# Patient Record
Sex: Female | Born: 1970 | Race: Black or African American | Hispanic: No | Marital: Single | Smoking: Never smoker
Health system: Southern US, Community
[De-identification: ages and names within clinical notes are randomized; demographics above are authoritative.]

## PROBLEM LIST (undated history)

## (undated) DIAGNOSIS — I10 Essential (primary) hypertension: Secondary | ICD-10-CM

## (undated) DIAGNOSIS — A749 Chlamydial infection, unspecified: Secondary | ICD-10-CM

## (undated) HISTORY — DX: Chlamydial infection, unspecified: A74.9

## (undated) HISTORY — PX: TUBAL LIGATION: SHX77

---

## 1997-08-02 ENCOUNTER — Ambulatory Visit (HOSPITAL_COMMUNITY): Admission: RE | Admit: 1997-08-02 | Discharge: 1997-08-02 | Payer: Self-pay | Admitting: *Deleted

## 1997-11-06 ENCOUNTER — Inpatient Hospital Stay (HOSPITAL_COMMUNITY): Admission: AD | Admit: 1997-11-06 | Discharge: 1997-11-06 | Payer: Self-pay | Admitting: Obstetrics

## 1997-11-22 ENCOUNTER — Inpatient Hospital Stay (HOSPITAL_COMMUNITY): Admission: AD | Admit: 1997-11-22 | Discharge: 1997-11-22 | Payer: Self-pay | Admitting: Obstetrics

## 1997-11-24 ENCOUNTER — Inpatient Hospital Stay (HOSPITAL_COMMUNITY): Admission: RE | Admit: 1997-11-24 | Discharge: 1997-11-24 | Payer: Self-pay | Admitting: Obstetrics

## 1997-12-21 ENCOUNTER — Inpatient Hospital Stay (HOSPITAL_COMMUNITY): Admission: AD | Admit: 1997-12-21 | Discharge: 1997-12-21 | Payer: Self-pay | Admitting: *Deleted

## 1997-12-24 ENCOUNTER — Inpatient Hospital Stay (HOSPITAL_COMMUNITY): Admission: AD | Admit: 1997-12-24 | Discharge: 1997-12-24 | Payer: Self-pay | Admitting: Obstetrics & Gynecology

## 1998-01-03 ENCOUNTER — Inpatient Hospital Stay (HOSPITAL_COMMUNITY): Admission: AD | Admit: 1998-01-03 | Discharge: 1998-01-03 | Payer: Self-pay | Admitting: Obstetrics & Gynecology

## 1998-01-04 ENCOUNTER — Inpatient Hospital Stay (HOSPITAL_COMMUNITY): Admission: AD | Admit: 1998-01-04 | Discharge: 1998-01-06 | Payer: Self-pay | Admitting: *Deleted

## 1998-10-19 ENCOUNTER — Encounter: Admission: RE | Admit: 1998-10-19 | Discharge: 1999-01-17 | Payer: Self-pay | Admitting: Specialist

## 2001-01-13 ENCOUNTER — Emergency Department (HOSPITAL_COMMUNITY): Admission: EM | Admit: 2001-01-13 | Discharge: 2001-01-13 | Payer: Self-pay | Admitting: Emergency Medicine

## 2001-04-25 ENCOUNTER — Ambulatory Visit (HOSPITAL_COMMUNITY): Admission: RE | Admit: 2001-04-25 | Discharge: 2001-04-25 | Payer: Self-pay | Admitting: *Deleted

## 2001-05-31 ENCOUNTER — Inpatient Hospital Stay (HOSPITAL_COMMUNITY): Admission: AD | Admit: 2001-05-31 | Discharge: 2001-05-31 | Payer: Self-pay | Admitting: *Deleted

## 2001-06-03 ENCOUNTER — Ambulatory Visit (HOSPITAL_COMMUNITY): Admission: RE | Admit: 2001-06-03 | Discharge: 2001-06-03 | Payer: Self-pay | Admitting: *Deleted

## 2001-06-03 ENCOUNTER — Encounter: Payer: Self-pay | Admitting: *Deleted

## 2001-08-23 ENCOUNTER — Inpatient Hospital Stay (HOSPITAL_COMMUNITY): Admission: AD | Admit: 2001-08-23 | Discharge: 2001-08-23 | Payer: Self-pay | Admitting: *Deleted

## 2001-08-27 ENCOUNTER — Inpatient Hospital Stay (HOSPITAL_COMMUNITY): Admission: AD | Admit: 2001-08-27 | Discharge: 2001-08-27 | Payer: Self-pay | Admitting: Obstetrics and Gynecology

## 2001-08-29 ENCOUNTER — Inpatient Hospital Stay (HOSPITAL_COMMUNITY): Admission: AD | Admit: 2001-08-29 | Discharge: 2001-08-29 | Payer: Self-pay | Admitting: *Deleted

## 2001-09-03 ENCOUNTER — Inpatient Hospital Stay (HOSPITAL_COMMUNITY): Admission: AD | Admit: 2001-09-03 | Discharge: 2001-09-03 | Payer: Self-pay | Admitting: Obstetrics and Gynecology

## 2001-09-04 ENCOUNTER — Inpatient Hospital Stay (HOSPITAL_COMMUNITY): Admission: AD | Admit: 2001-09-04 | Discharge: 2001-09-04 | Payer: Self-pay | Admitting: *Deleted

## 2001-09-08 ENCOUNTER — Inpatient Hospital Stay (HOSPITAL_COMMUNITY): Admission: AD | Admit: 2001-09-08 | Discharge: 2001-09-08 | Payer: Self-pay | Admitting: *Deleted

## 2001-09-23 ENCOUNTER — Encounter (INDEPENDENT_AMBULATORY_CARE_PROVIDER_SITE_OTHER): Payer: Self-pay

## 2001-09-23 ENCOUNTER — Inpatient Hospital Stay (HOSPITAL_COMMUNITY): Admission: AD | Admit: 2001-09-23 | Discharge: 2001-09-25 | Payer: Self-pay | Admitting: *Deleted

## 2001-10-24 ENCOUNTER — Emergency Department (HOSPITAL_COMMUNITY): Admission: EM | Admit: 2001-10-24 | Discharge: 2001-10-24 | Payer: Self-pay | Admitting: Emergency Medicine

## 2002-12-08 ENCOUNTER — Other Ambulatory Visit: Admission: RE | Admit: 2002-12-08 | Discharge: 2002-12-08 | Payer: Self-pay | Admitting: Family Medicine

## 2004-04-24 ENCOUNTER — Ambulatory Visit: Payer: Self-pay | Admitting: Family Medicine

## 2004-05-11 ENCOUNTER — Ambulatory Visit: Payer: Self-pay | Admitting: Family Medicine

## 2004-05-11 ENCOUNTER — Other Ambulatory Visit: Admission: RE | Admit: 2004-05-11 | Discharge: 2004-05-11 | Payer: Self-pay | Admitting: Family Medicine

## 2004-05-15 ENCOUNTER — Ambulatory Visit: Payer: Self-pay | Admitting: *Deleted

## 2004-08-24 ENCOUNTER — Ambulatory Visit: Payer: Self-pay | Admitting: Family Medicine

## 2005-05-14 ENCOUNTER — Ambulatory Visit: Payer: Self-pay | Admitting: Family Medicine

## 2006-06-19 ENCOUNTER — Encounter (INDEPENDENT_AMBULATORY_CARE_PROVIDER_SITE_OTHER): Payer: Self-pay | Admitting: Family Medicine

## 2006-06-19 ENCOUNTER — Ambulatory Visit: Payer: Self-pay | Admitting: Family Medicine

## 2006-06-21 ENCOUNTER — Encounter (INDEPENDENT_AMBULATORY_CARE_PROVIDER_SITE_OTHER): Payer: Self-pay | Admitting: Family Medicine

## 2006-06-21 LAB — CONVERTED CEMR LAB: Pap Smear: NORMAL

## 2006-07-03 ENCOUNTER — Ambulatory Visit: Payer: Self-pay | Admitting: Family Medicine

## 2006-07-17 ENCOUNTER — Ambulatory Visit: Payer: Self-pay | Admitting: Family Medicine

## 2006-07-18 ENCOUNTER — Encounter (INDEPENDENT_AMBULATORY_CARE_PROVIDER_SITE_OTHER): Payer: Self-pay | Admitting: Family Medicine

## 2006-07-18 LAB — CONVERTED CEMR LAB: TSH: 1.861 microintl units/mL

## 2007-02-05 ENCOUNTER — Encounter (INDEPENDENT_AMBULATORY_CARE_PROVIDER_SITE_OTHER): Payer: Self-pay | Admitting: Family Medicine

## 2007-02-05 DIAGNOSIS — Z9189 Other specified personal risk factors, not elsewhere classified: Secondary | ICD-10-CM | POA: Insufficient documentation

## 2007-02-19 ENCOUNTER — Encounter (INDEPENDENT_AMBULATORY_CARE_PROVIDER_SITE_OTHER): Payer: Self-pay | Admitting: *Deleted

## 2007-03-10 DIAGNOSIS — I10 Essential (primary) hypertension: Secondary | ICD-10-CM | POA: Insufficient documentation

## 2007-06-09 ENCOUNTER — Ambulatory Visit: Payer: Self-pay | Admitting: Internal Medicine

## 2007-06-09 ENCOUNTER — Inpatient Hospital Stay (HOSPITAL_COMMUNITY): Admission: AD | Admit: 2007-06-09 | Discharge: 2007-06-10 | Payer: Self-pay | Admitting: Obstetrics & Gynecology

## 2007-07-16 ENCOUNTER — Encounter: Payer: Self-pay | Admitting: Internal Medicine

## 2007-07-16 ENCOUNTER — Ambulatory Visit: Payer: Self-pay | Admitting: Internal Medicine

## 2007-07-16 ENCOUNTER — Encounter: Payer: Self-pay | Admitting: Family Medicine

## 2008-02-18 ENCOUNTER — Ambulatory Visit: Payer: Self-pay | Admitting: Internal Medicine

## 2008-07-10 ENCOUNTER — Emergency Department (HOSPITAL_COMMUNITY): Admission: EM | Admit: 2008-07-10 | Discharge: 2008-07-10 | Payer: Self-pay | Admitting: Emergency Medicine

## 2008-07-22 ENCOUNTER — Encounter (INDEPENDENT_AMBULATORY_CARE_PROVIDER_SITE_OTHER): Payer: Self-pay | Admitting: Adult Health

## 2008-07-22 ENCOUNTER — Ambulatory Visit: Payer: Self-pay | Admitting: Internal Medicine

## 2008-07-22 LAB — CONVERTED CEMR LAB
ALT: 13 units/L (ref 0–35)
AST: 15 units/L (ref 0–37)
Albumin: 4 g/dL (ref 3.5–5.2)
Alkaline Phosphatase: 58 units/L (ref 39–117)
BUN: 12 mg/dL (ref 6–23)
Basophils Absolute: 0 10*3/uL (ref 0.0–0.1)
Basophils Relative: 0 % (ref 0–1)
CO2: 23 meq/L (ref 19–32)
Calcium: 9 mg/dL (ref 8.4–10.5)
Chlamydia, DNA Probe: NEGATIVE
Chloride: 107 meq/L (ref 96–112)
Creatinine, Ser: 0.78 mg/dL (ref 0.40–1.20)
Eosinophils Absolute: 0.1 10*3/uL (ref 0.0–0.7)
Eosinophils Relative: 2 % (ref 0–5)
Glucose, Bld: 97 mg/dL (ref 70–99)
HCT: 39.4 % (ref 36.0–46.0)
Hemoglobin: 13 g/dL (ref 12.0–15.0)
Lymphocytes Relative: 39 % (ref 12–46)
Lymphs Abs: 2.6 10*3/uL (ref 0.7–4.0)
MCHC: 33 g/dL (ref 30.0–36.0)
MCV: 83.8 fL (ref 78.0–100.0)
Monocytes Absolute: 0.6 10*3/uL (ref 0.1–1.0)
Monocytes Relative: 9 % (ref 3–12)
Neutro Abs: 3.3 10*3/uL (ref 1.7–7.7)
Neutrophils Relative %: 50 % (ref 43–77)
Platelets: 316 10*3/uL (ref 150–400)
Potassium: 3.4 meq/L — ABNORMAL LOW (ref 3.5–5.3)
RBC: 4.7 M/uL (ref 3.87–5.11)
RDW: 13.4 % (ref 11.5–15.5)
Sodium: 141 meq/L (ref 135–145)
TSH: 1.078 microintl units/mL (ref 0.350–4.50)
Total Bilirubin: 0.3 mg/dL (ref 0.3–1.2)
Total Protein: 7.3 g/dL (ref 6.0–8.3)
WBC: 6.7 10*3/uL (ref 4.0–10.5)

## 2008-08-12 ENCOUNTER — Encounter (INDEPENDENT_AMBULATORY_CARE_PROVIDER_SITE_OTHER): Payer: Self-pay | Admitting: Adult Health

## 2008-08-12 ENCOUNTER — Ambulatory Visit: Payer: Self-pay | Admitting: Internal Medicine

## 2008-08-12 LAB — CONVERTED CEMR LAB: LDL Cholesterol: 77 mg/dL (ref 0–99)

## 2009-02-09 ENCOUNTER — Ambulatory Visit: Payer: Self-pay | Admitting: Family Medicine

## 2009-02-10 ENCOUNTER — Encounter (INDEPENDENT_AMBULATORY_CARE_PROVIDER_SITE_OTHER): Payer: Self-pay | Admitting: *Deleted

## 2009-02-10 LAB — CONVERTED CEMR LAB: Microalb, Ur: 2.16 mg/dL — ABNORMAL HIGH (ref 0.00–1.89)

## 2009-08-30 ENCOUNTER — Emergency Department (HOSPITAL_COMMUNITY): Admission: EM | Admit: 2009-08-30 | Discharge: 2009-08-30 | Payer: Self-pay | Admitting: Family Medicine

## 2009-09-13 ENCOUNTER — Ambulatory Visit: Payer: Self-pay | Admitting: Family Medicine

## 2009-09-13 ENCOUNTER — Encounter (INDEPENDENT_AMBULATORY_CARE_PROVIDER_SITE_OTHER): Payer: Self-pay | Admitting: Adult Health

## 2009-09-13 LAB — CONVERTED CEMR LAB
AST: 13 units/L (ref 0–37)
Albumin: 4 g/dL (ref 3.5–5.2)
Alkaline Phosphatase: 53 units/L (ref 39–117)
BUN: 12 mg/dL (ref 6–23)
Basophils Absolute: 0 10*3/uL (ref 0.0–0.1)
Eosinophils Relative: 2 % (ref 0–5)
GC Probe Amp, Genital: NEGATIVE
Lymphocytes Relative: 44 % (ref 12–46)
Neutro Abs: 2.9 10*3/uL (ref 1.7–7.7)
Pap Smear: NEGATIVE
Platelets: 248 10*3/uL (ref 150–400)
Potassium: 3.4 meq/L — ABNORMAL LOW (ref 3.5–5.3)
RDW: 13.5 % (ref 11.5–15.5)
Sodium: 136 meq/L (ref 135–145)
Vit D, 25-Hydroxy: 10 ng/mL — ABNORMAL LOW (ref 30–89)

## 2009-11-28 ENCOUNTER — Ambulatory Visit: Payer: Self-pay | Admitting: Internal Medicine

## 2009-11-30 ENCOUNTER — Ambulatory Visit: Payer: Self-pay | Admitting: Internal Medicine

## 2010-06-08 ENCOUNTER — Encounter (INDEPENDENT_AMBULATORY_CARE_PROVIDER_SITE_OTHER): Payer: Self-pay | Admitting: *Deleted

## 2010-06-08 LAB — CONVERTED CEMR LAB
CO2: 28 meq/L (ref 19–32)
Chloride: 105 meq/L (ref 96–112)
Creatinine, Ser: 0.96 mg/dL (ref 0.40–1.20)
Potassium: 3.8 meq/L (ref 3.5–5.3)

## 2010-10-20 NOTE — Op Note (Signed)
[facility]  Patient:    Leslie Wang, Leslie Wang Visit Number: [id number] MRN: [id number]          Service Type: OBS Location: [id number] Attending Physician:  Michaelle Copas Dictated by:   Ed Blalock. Burnadette Peter, M.D. Proc. Date: 09/24/01 Admit Date:  09/23/2001                             Operative Report  DATE OF BIRTH:                [birth year]  PREOPERATIVE DIAGNOSES:       Multiparity, desires permanent sterilization.  POSTOPERATIVE DIAGNOSES:      Multiparity, desires permanent sterilization.  PROCEDURE:                    Postpartum tubal ligation.  SURGEON:                      Dr. Okey Dupre  ASSISTANT:                    Dr. Burnadette Peter  ANESTHESIA:                   Epidural.  COMPLICATIONS:                None.  ESTIMATED BLOOD LOSS:         Minimal.  FLUIDS:                       1000 cc LR.  URINE OUTPUT:                 None.  INDICATIONS:                  This is a 40 year old G4, P3 who is postpartum day #1 from a spontaneous vaginal delivery who desires permanent sterilization.  Risks, benefits of the procedure were discussed with the patient including bleeding, infection, risk of damage to surrounding organs, and an increased risk of ectopic gestation if pregnancy occurs.  Patient wishes to proceed.  FINDINGS:                     Normal uterus, ovaries, and tubes.  PROCEDURE:                    Patient was taken to OR where her epidural was redosed and found to be adequate.  A small transverse infraumbilical incision was made with the scalpel.  The incision was carried down to the underlying fascia until the peritoneum was identified and entered.  The peritoneum was noted to be free of any adhesions and incision was extended with Metzenbaum scissors.  The patients left fallopian tube was identified, brought through the incision, and grasped with Babcock clamp.  The tube was then followed out to the fimbria.  The  Babcock clamp was then used to grasp the tube approximately 4 cm from the cornual region.  A 3 cm segment of tube was then ligated with a free tie of plain gut x2 and then excised.  Good hemostasis was noted and the tube was returned to the abdomen.  The right fallopian tube was then ligated and a 3 cm segment excised in a similar fashion.  Excellent hemostasis.  Tube returned to the abdomen.  The peritoneum and fascia were then closed  in a single layer with 0 Vicryl. Second layer was placed subcutaneously with the same suture for good closure. The skin was then closed with Dermabond.  The patient tolerated the procedure well.  Sponge, lap, and needle counts were correct x2.  The patient was taken to the recovery room in stable condition.  PATHOLOGY:                    Segments of the right and left fallopian tubes were sent. Dictated by:   Ed Blalock. Burnadette Peter, M.D. Attending Physician:  Michaelle Copas DD:  09/24/01 TD:  09/24/01 Job: [id number]

## 2011-02-21 LAB — POCT PREGNANCY, URINE
Operator id: 12584
Preg Test, Ur: NEGATIVE

## 2011-02-21 LAB — CBC
HCT: 40.5
Platelets: 339
RDW: 12.9

## 2011-02-21 LAB — URINE MICROSCOPIC-ADD ON

## 2011-02-21 LAB — URINALYSIS, ROUTINE W REFLEX MICROSCOPIC
Bilirubin Urine: NEGATIVE
Glucose, UA: NEGATIVE
Hgb urine dipstick: NEGATIVE
Ketones, ur: 15 — AB
Nitrite: NEGATIVE
Protein, ur: NEGATIVE
Specific Gravity, Urine: >1.03 — ABNORMAL HIGH
Urobilinogen, UA: 0.2
pH: 5

## 2011-02-21 LAB — WET PREP, GENITAL: Yeast Wet Prep HPF POC: NONE SEEN

## 2011-10-02 ENCOUNTER — Other Ambulatory Visit: Payer: Self-pay | Admitting: Specialist

## 2011-10-02 DIAGNOSIS — R1900 Intra-abdominal and pelvic swelling, mass and lump, unspecified site: Secondary | ICD-10-CM

## 2011-10-04 ENCOUNTER — Other Ambulatory Visit: Payer: Self-pay

## 2011-10-09 ENCOUNTER — Other Ambulatory Visit: Payer: Self-pay

## 2011-10-10 ENCOUNTER — Other Ambulatory Visit: Payer: Self-pay

## 2011-10-11 ENCOUNTER — Ambulatory Visit
Admission: RE | Admit: 2011-10-11 | Discharge: 2011-10-11 | Disposition: A | Discharge status: Home / Self Care | Payer: Medicaid Other | Source: Ambulatory Visit | Attending: Specialist | Admitting: Specialist

## 2011-10-11 DIAGNOSIS — R1900 Intra-abdominal and pelvic swelling, mass and lump, unspecified site: Secondary | ICD-10-CM

## 2012-08-07 ENCOUNTER — Encounter (HOSPITAL_COMMUNITY): Payer: Self-pay | Admitting: Emergency Medicine

## 2012-08-07 ENCOUNTER — Emergency Department (HOSPITAL_COMMUNITY)
Admission: EM | Admit: 2012-08-07 | Discharge: 2012-08-07 | Disposition: A | Discharge status: Home / Self Care | Payer: Medicaid Other | Source: Home / Self Care | Attending: Emergency Medicine | Admitting: Emergency Medicine

## 2012-08-07 DIAGNOSIS — S81009A Unspecified open wound, unspecified knee, initial encounter: Secondary | ICD-10-CM

## 2012-08-07 HISTORY — DX: Essential (primary) hypertension: I10

## 2012-08-07 MED ORDER — HYDROCODONE-ACETAMINOPHEN 5-325 MG PO TABS: ORAL_TABLET | ORAL | Status: DC

## 2012-08-07 MED ORDER — CEPHALEXIN 500 MG PO CAPS: 500.0000 mg | ORAL_CAPSULE | Freq: Three times a day (TID) | ORAL | Status: DC

## 2012-08-07 NOTE — ED Notes (Signed)
Laceration to left foot, injured yesterday

## 2012-08-07 NOTE — ED Notes (Signed)
MD at bedside. 

## 2012-08-07 NOTE — ED Notes (Signed)
Patient being treated and son being treated in the same treatment room

## 2012-08-07 NOTE — ED Provider Notes (Addendum)
Chief Complaint  Patient presents with  . Laceration    History of Present Illness:   Leslie Wang is a 42 year old female who dropped a picture on her left foot last night while she was trying to hang it. This caused a laceration over the medial aspect of the ankle. This was a little bit of blood but otherwise bleeding has been controlled. It feels numb and hurts. Has been no redness or swelling. No purulent drainage. She did have a tetanus shot about a year ago. Past medical history is positive for hypertension.  Review of Systems:  Other than noted above, the patient denies any of the following symptoms: Systemic:  No fever or chills. Musculoskeletal:  No joint pain or decreased range of motion. Neuro:  No numbness, tingling, or weakness.  PMFSH:  Past medical history, family history, social history, meds, and allergies were reviewed.  Physical Exam:   Vital signs:  BP 144/92  Pulse 80  Temp(Src) 98.5 F (36.9 C) (Oral)  Resp 16  SpO2 100%  LMP 08/07/2012 Ext:  There is a small annular shaped skin tear over the medial aspect of the left ankle. There is no evidence of infection with no surrounding erythema, pain to palpation, or purulent drainage.  All other joints had a full ROM without pain.  Pulses were full.  Good capillary refill in all digits.  No edema. Neurological:  Alert and oriented.  No muscle weakness.  Sensation was intact to light touch.   Procedure: Verbal informed consent was obtained.  The patient was informed of the risks and benefits of the procedure and understands and accepts.  Identity of the patient was verified verbally and by wristband.   The laceration area described above was cleansed with saline, tincture of benzoin was applied and the skin tear was Steri-Stripped down. A Telfa dressing was applied and Coban wrap. The patient was instructed in wound care. She should avoid applying antibiotic ointment. She should allow the Steri-Strips to fall off on their  own.   Assessment:  The encounter diagnosis was Skin tear of lower leg without complication, left, initial encounter.  Plan:   1.  The following meds were prescribed:   Discharge Medication List as of 08/07/2012  5:25 PM    START taking these medications   Details  cephALEXin (KEFLEX) 500 MG capsule Take 1 capsule (500 mg total) by mouth 3 (three) times daily., Starting 08/07/2012, Until Discontinued, Normal    HYDROcodone-acetaminophen (NORCO/VICODIN) 5-325 MG per tablet 1 to 2 tabs every 4 to 6 hours as needed for pain., Print       2.  The patient was instructed in wound care and pain control, and handouts were given. 3.  The patient was told to return if any sign of infection.     Reuben Likes, MD 08/07/12 1900  Reuben Likes, MD 08/07/12 1901

## 2013-12-05 ENCOUNTER — Encounter (HOSPITAL_COMMUNITY): Payer: Self-pay | Admitting: Emergency Medicine

## 2013-12-05 ENCOUNTER — Emergency Department (HOSPITAL_COMMUNITY): Payer: Medicaid Other

## 2013-12-05 ENCOUNTER — Emergency Department (INDEPENDENT_AMBULATORY_CARE_PROVIDER_SITE_OTHER): Payer: Medicaid Other

## 2013-12-05 ENCOUNTER — Emergency Department (HOSPITAL_COMMUNITY)
Admission: EM | Admit: 2013-12-05 | Discharge: 2013-12-05 | Disposition: A | Discharge status: Home / Self Care | Payer: Medicaid Other | Source: Home / Self Care | Attending: Emergency Medicine | Admitting: Emergency Medicine

## 2013-12-05 DIAGNOSIS — L03119 Cellulitis of unspecified part of limb: Secondary | ICD-10-CM

## 2013-12-05 DIAGNOSIS — M79671 Pain in right foot: Secondary | ICD-10-CM

## 2013-12-05 DIAGNOSIS — L02419 Cutaneous abscess of limb, unspecified: Secondary | ICD-10-CM | POA: Diagnosis not present

## 2013-12-05 DIAGNOSIS — M79609 Pain in unspecified limb: Secondary | ICD-10-CM

## 2013-12-05 LAB — CBC WITH DIFFERENTIAL/PLATELET
Basophils Absolute: 0 10*3/uL (ref 0.0–0.1)
Basophils Relative: 0 % (ref 0–1)
Eosinophils Absolute: 0.1 10*3/uL (ref 0.0–0.7)
Eosinophils Relative: 2 % (ref 0–5)
HCT: 38 % (ref 36.0–46.0)
Hemoglobin: 12.8 g/dL (ref 12.0–15.0)
Lymphocytes Relative: 34 % (ref 12–46)
Lymphs Abs: 2.3 10*3/uL (ref 0.7–4.0)
MCH: 28.2 pg (ref 26.0–34.0)
MCHC: 33.7 g/dL (ref 30.0–36.0)
MCV: 83.7 fL (ref 78.0–100.0)
Monocytes Absolute: 0.4 10*3/uL (ref 0.1–1.0)
Monocytes Relative: 6 % (ref 3–12)
Neutro Abs: 4.1 10*3/uL (ref 1.7–7.7)
Neutrophils Relative %: 58 % (ref 43–77)
Platelets: 241 10*3/uL (ref 150–400)
RBC: 4.54 MIL/uL (ref 3.87–5.11)
RDW: 13.3 % (ref 11.5–15.5)
WBC: 7 10*3/uL (ref 4.0–10.5)

## 2013-12-05 LAB — URIC ACID: Uric Acid, Serum: 5.8 mg/dL (ref 2.4–7.0)

## 2013-12-05 MED ORDER — IBUPROFEN 800 MG PO TABS
ORAL_TABLET | ORAL | Status: AC
  Filled 2013-12-05: qty 1

## 2013-12-05 MED ORDER — CEPHALEXIN 500 MG PO CAPS: 500.0000 mg | ORAL_CAPSULE | Freq: Three times a day (TID) | ORAL | Status: DC

## 2013-12-05 MED ORDER — PREDNISONE 20 MG PO TABS: 20.0000 mg | ORAL_TABLET | Freq: Two times a day (BID) | ORAL | Status: DC

## 2013-12-05 MED ORDER — OXYCODONE-ACETAMINOPHEN 5-325 MG PO TABS: ORAL_TABLET | ORAL | Status: DC

## 2013-12-05 MED ORDER — SULFAMETHOXAZOLE-TMP DS 800-160 MG PO TABS: 1.0000 | ORAL_TABLET | Freq: Two times a day (BID) | ORAL | Status: DC

## 2013-12-05 MED ORDER — COLCHICINE 0.6 MG PO TABS: ORAL_TABLET | ORAL | Status: AC

## 2013-12-05 MED ORDER — IBUPROFEN 800 MG PO TABS
800.0000 mg | ORAL_TABLET | Freq: Once | ORAL | Status: AC
  Administered 2013-12-05: 800 mg via ORAL

## 2013-12-05 NOTE — ED Notes (Signed)
States woke up with right foot pain.  Denies any injury.

## 2013-12-05 NOTE — Discharge Instructions (Signed)
Cellulitis °Cellulitis is an infection of the skin and the tissue beneath it. The infected area is usually red and tender. Cellulitis occurs most often in the arms and lower legs.  °CAUSES  °Cellulitis is caused by bacteria that enter the skin through cracks or cuts in the skin. The most common types of bacteria that cause cellulitis are Staphylococcus and Streptococcus. °SYMPTOMS  °· Redness and warmth. °· Swelling. °· Tenderness or pain. °· Fever. °DIAGNOSIS  °Your caregiver can usually determine what is wrong based on a physical exam. Blood tests may also be done. °TREATMENT  °Treatment usually involves taking an antibiotic medicine. °HOME CARE INSTRUCTIONS  °· Take your antibiotics as directed. Finish them even if you start to feel better. °· Keep the infected arm or leg elevated to reduce swelling. °· Apply a warm cloth to the affected area up to 4 times per day to relieve pain. °· Only take over-the-counter or prescription medicines for pain, discomfort, or fever as directed by your caregiver. °· Keep all follow-up appointments as directed by your caregiver. °SEEK MEDICAL CARE IF:  °· You notice red streaks coming from the infected area. °· Your red area gets larger or turns dark in color. °· Your bone or joint underneath the infected area becomes painful after the skin has healed. °· Your infection returns in the same area or another area. °· You notice a swollen bump in the infected area. °· You develop new symptoms. °SEEK IMMEDIATE MEDICAL CARE IF:  °· You have a fever. °· You feel very sleepy. °· You develop vomiting or diarrhea. °· You have a general ill feeling (malaise) with muscle aches and pains. °MAKE SURE YOU:  °· Understand these instructions. °· Will watch your condition. °· Will get help right away if you are not doing well or get worse. °Document Released: 02/28/2005 Document Revised: 11/20/2011 Document Reviewed: 08/06/2011 °ExitCare® Patient Information ©2015 [employer]. This information is  not intended to replace advice given to you by your health care provider. Make sure you discuss any questions you have with your health care provider. ° °Gout °Gout is an inflammatory arthritis caused by a buildup of uric acid crystals in the joints. Uric acid is a chemical that is normally present in the blood. When the level of uric acid in the blood is too high it can form crystals that deposit in your joints and tissues. This causes joint redness, soreness, and swelling (inflammation). Repeat attacks are common. Over time, uric acid crystals can form into masses (tophi) near a joint, destroying bone and causing disfigurement. Gout is treatable and often preventable. °CAUSES  °The disease begins with elevated levels of uric acid in the blood. Uric acid is produced by your body when it breaks down a naturally found substance called purines. Certain foods you eat, such as meats and fish, contain high amounts of purines. Causes of an elevated uric acid level include: °· Being passed down from parent to child (heredity). °· Diseases that cause increased uric acid production (such as obesity, psoriasis, and certain cancers). °· Excessive alcohol use. °· Diet, especially diets rich in meat and seafood. °· Medicines, including certain cancer-fighting medicines (chemotherapy), water pills (diuretics), and aspirin. °· Chronic kidney disease. The kidneys are no longer able to remove uric acid well. °· Problems with metabolism. °Conditions strongly associated with gout include: °· Obesity. °· High blood pressure. °· High cholesterol. °· Diabetes. °Not everyone with elevated uric acid levels gets gout. It is not understood why some   people get gout and others do not. Surgery, joint injury, and eating too much of certain foods are some of the factors that can lead to gout attacks. °SYMPTOMS  °· An attack of gout comes on quickly. It causes intense pain with redness, swelling, and warmth in a joint. °· Fever can  occur. °· Often, only one joint is involved. Certain joints are more commonly involved: °¨ Base of the big toe. °¨ Knee. °¨ Ankle. °¨ Wrist. °¨ Finger. °Without treatment, an attack usually goes away in a few days to weeks. Between attacks, you usually will not have symptoms, which is different from many other forms of arthritis. °DIAGNOSIS  °Your caregiver will suspect gout based on your symptoms and exam. In some cases, tests may be recommended. The tests may include: °· Blood tests. °· Urine tests. °· X-rays. °· Joint fluid exam. This exam requires a needle to remove fluid from the joint (arthrocentesis). Using a microscope, gout is confirmed when uric acid crystals are seen in the joint fluid. °TREATMENT  °There are two phases to gout treatment: treating the sudden onset (acute) attack and preventing attacks (prophylaxis). °· Treatment of an Acute Attack. °¨ Medicines are used. These include anti-inflammatory medicines or steroid medicines. °¨ An injection of steroid medicine into the affected joint is sometimes necessary. °¨ The painful joint is rested. Movement can worsen the arthritis. °¨ You may use warm or cold treatments on painful joints, depending which works best for you. °· Treatment to Prevent Attacks. °¨ If you suffer from frequent gout attacks, your caregiver may advise preventive medicine. These medicines are started after the acute attack subsides. These medicines either help your kidneys eliminate uric acid from your body or decrease your uric acid production. You may need to stay on these medicines for a very long time. °¨ The early phase of treatment with preventive medicine can be associated with an increase in acute gout attacks. For this reason, during the first few months of treatment, your caregiver may also advise you to take medicines usually used for acute gout treatment. Be sure you understand your caregiver's directions. Your caregiver may make several adjustments to your medicine  dose before these medicines are effective. °¨ Discuss dietary treatment with your caregiver or dietitian. Alcohol and drinks high in sugar and fructose and foods such as meat, poultry, and seafood can increase uric acid levels. Your caregiver or dietician can advise you on drinks and foods that should be limited. °HOME CARE INSTRUCTIONS  °· Do not take aspirin to relieve pain. This raises uric acid levels. °· Only take over-the-counter or prescription medicines for pain, discomfort, or fever as directed by your caregiver. °· Rest the joint as much as possible. When in bed, keep sheets and blankets off painful areas. °· Keep the affected joint raised (elevated). °· Apply warm or cold treatments to painful joints. Use of warm or cold treatments depends on which works best for you. °· Use crutches if the painful joint is in your leg. °· Drink enough fluids to keep your urine clear or pale yellow. This helps your body get rid of uric acid. Limit alcohol, sugary drinks, and fructose drinks. °· Follow your dietary instructions. Pay careful attention to the amount of protein you eat. Your daily diet should emphasize fruits, vegetables, whole grains, and fat-free or low-fat milk products. Discuss the use of coffee, vitamin C, and cherries with your caregiver or dietician. These may be helpful in lowering uric acid levels. °· Maintain a   healthy body weight. °SEEK MEDICAL CARE IF:  °· You develop diarrhea, vomiting, or any side effects from medicines. °· You do not feel better in 24 hours, or you are getting worse. °SEEK IMMEDIATE MEDICAL CARE IF:  °· Your joint becomes suddenly more tender, and you have chills or a fever. °MAKE SURE YOU:  °· Understand these instructions. °· Will watch your condition. °· Will get help right away if you are not doing well or get worse. °Document Released: 05/18/2000 Document Revised: 09/15/2012 Document Reviewed: 01/02/2012 °ExitCare® Patient Information ©2015 [employer]. This information  is not intended to replace advice given to you by your health care provider. Make sure you discuss any questions you have with your health care provider. ° °

## 2013-12-05 NOTE — ED Provider Notes (Signed)
Chief Complaint   Chief Complaint  Patient presents with  . Foot Pain    History of Present Illness   Leslie Wang is a 43 year old female who woke up this morning with pain and swelling over the dorsum of the right foot. She denies any injury to the foot. She's had no fever or chills. The pain seems to be concentrated with the fourth and fifth metatarsals. She has no skin lesions and no purulent drainage. The pain, swelling, and discomfort seem to be radiating up into the ankle and into the lower leg. She has no history of gout and no history of similar episodes in the past.  Review of Systems   Other than as noted above, the patient denies any of the following symptoms: Systemic:  No fevers or chills. Musculoskeletal:  No joint pain or arthritis.  Neurological:  No muscular weakness, paresthesias.   PMFSH   Past medical history, family history, social history, meds, and allergies were reviewed.   She has high blood pressure and takes lisinopril and metoprolol.  Physical  Examination     Vital signs:  BP 122/88  Pulse 76  Temp(Src) 98.6 F (37 C) (Oral)  Resp 16  SpO2 99%  LMP 11/11/2013 Gen:  Alert and oriented times 3.  In no distress. Musculoskeletal:  Exam of the foot reveals there is minimal swelling and erythema over the dorsum of the foot overlying the fourth and fifth metatarsals extending to the webspace between the fourth and fifth toes. I was unable to get a good look between these toes since she is so painful and a cystoscopy tender. She also has some tenderness to palpation the ankle but not in the calf or lower leg. The ankle is not swollen.  Otherwise, all joints had a full a ROM with no swelling, bruising or deformity.  No edema, pulses full. Extremities were warm and pink.  Capillary refill was brisk.  Skin:  Clear, warm and dry.  No rash. Neuro:  Alert and oriented times 3.  Muscle strength was normal.  Sensation was intact to light touch.    Radiology    Dg Foot Complete Right  12/05/2013   CLINICAL DATA:  FOOT PAIN FOOT PAIN  EXAM: RIGHT FOOT COMPLETE - 3+ VIEW  COMPARISON:  None.  FINDINGS: There is no evidence of fracture or dislocation. There is no evidence of arthropathy or other focal bone abnormality. Soft tissues are unremarkable.  IMPRESSION: Negative.   Electronically Signed   By: Salome HolmesHector  Cooper M.D.   On: 12/05/2013 15:23   I reviewed the images independently and personally and concur with the radiologist's findings.   Labs   Results for orders placed during the hospital encounter of 12/05/13  CBC WITH DIFFERENTIAL      Result Value Ref Range   WBC 7.0  4.0 - 10.5 K/uL   RBC 4.54  3.87 - 5.11 MIL/uL   Hemoglobin 12.8  12.0 - 15.0 g/dL   HCT 16.138.0  09.636.0 - 04.546.0 %   MCV 83.7  78.0 - 100.0 fL   MCH 28.2  26.0 - 34.0 pg   MCHC 33.7  30.0 - 36.0 g/dL   RDW 40.913.3  81.111.5 - 91.415.5 %   Platelets 241  150 - 400 K/uL   Neutrophils Relative % 58  43 - 77 %   Neutro Abs 4.1  1.7 - 7.7 K/uL   Lymphocytes Relative 34  12 - 46 %   Lymphs Abs 2.3  0.7 -  4.0 K/uL   Monocytes Relative 6  3 - 12 %   Monocytes Absolute 0.4  0.1 - 1.0 K/uL   Eosinophils Relative 2  0 - 5 %   Eosinophils Absolute 0.1  0.0 - 0.7 K/uL   Basophils Relative 0  0 - 1 %   Basophils Absolute 0.0  0.0 - 0.1 K/uL   A uric acid level was also obtained.  Course in Urgent Care Center   She was placed in a postoperative boot.  Assessment   The encounter diagnosis was Foot pain, right.  Differential diagnosis includes gout or cellulitis.  Plan    1.  Meds:  The following meds were prescribed:   Discharge Medication List as of 12/05/2013  3:32 PM    START taking these medications   Details  !! cephALEXin (KEFLEX) 500 MG capsule Take 1 capsule (500 mg total) by mouth 3 (three) times daily., Starting 12/05/2013, Until Discontinued, Normal    colchicine 0.6 MG tablet Take 2 now and 1 in 1 hour.  May repeat dose once daily.  For gout attack., Normal     oxyCODONE-acetaminophen (PERCOCET) 5-325 MG per tablet 1 to 2 tablets every 6 hours as needed for pain., Print    predniSONE (DELTASONE) 20 MG tablet Take 1 tablet (20 mg total) by mouth 2 (two) times daily., Starting 12/05/2013, Until Discontinued, Normal    sulfamethoxazole-trimethoprim (BACTRIM DS) 800-160 MG per tablet Take 1 tablet by mouth 2 (two) times daily., Starting 12/05/2013, Until Discontinued, Normal     !! - Potential duplicate medications found. Please discuss with provider.      2.  Patient Education/Counseling:  The patient was given appropriate handouts, self care instructions, and instructed in symptomatic relief including rest and activity, elevation, application of ice and compression.  She was instructed to stay off of her feet and elevate her leg for the next 2 days. Return again on Monday for followup. I will call her back about lab results.  3.  Follow up:  The patient was told to follow up here in 2 days, or sooner if becoming worse in any way, and given some red flag symptoms such as worsening pain, fever, or progressive swelling which would prompt immediate return.      Reuben Likesavid C Rosie Golson, MD 12/05/13 [phone]

## 2013-12-06 NOTE — Progress Notes (Signed)
Quick Note:  Test result was normal. No further action is needed at this time. The patient was called and informed of these normal results. She states her foot is still sore, but the swelling has gone down a little bit. She denies any fever. She is taking all her medications. She plans to return tomorrow for recheck. ______

## 2013-12-07 ENCOUNTER — Encounter (HOSPITAL_COMMUNITY): Payer: Self-pay | Admitting: Emergency Medicine

## 2013-12-07 ENCOUNTER — Emergency Department (HOSPITAL_COMMUNITY)
Admission: EM | Admit: 2013-12-07 | Discharge: 2013-12-07 | Disposition: A | Discharge status: Home / Self Care | Payer: Medicaid Other | Source: Home / Self Care

## 2013-12-07 DIAGNOSIS — L02419 Cutaneous abscess of limb, unspecified: Secondary | ICD-10-CM

## 2013-12-07 DIAGNOSIS — L03119 Cellulitis of unspecified part of limb: Secondary | ICD-10-CM

## 2013-12-07 DIAGNOSIS — L03115 Cellulitis of right lower limb: Secondary | ICD-10-CM

## 2013-12-07 DIAGNOSIS — Z09 Encounter for follow-up examination after completed treatment for conditions other than malignant neoplasm: Secondary | ICD-10-CM

## 2013-12-07 NOTE — ED Notes (Signed)
Pt is here for a f/u on right foot pain Seen here on 7/4; given antibiotics and steroids Getting better Alert w/no signs of acute distress.

## 2013-12-07 NOTE — ED Provider Notes (Signed)
CSN: [id number]     Arrival date & time 12/07/13  1608 History   None    Chief Complaint  Patient presents with  . Follow-up   (Consider location/radiation/quality/duration/timing/severity/associated sxs/prior Treatment)  HPI  She is a 43 year old female presenting today for followup after seeing Dr. Lorenz CoasterKeller this past weekend. Patient states that her foot is "much better". Patient's only concern is that she wishes to stop taking her prednisone as she states that it is making it in "impossible for her to sleep". Patient states swelling and discomfort is significantly reduced.     Past Medical History  Diagnosis Date  . Hypertension    History reviewed. No pertinent past surgical history. No family history on file. History  Substance Use Topics  . Smoking status: Never Smoker   . Smokeless tobacco: Not on file  . Alcohol Use: No   OB History   Grav Para Term Preterm Abortions TAB SAB Ect Mult Living                 Review of Systems  Constitutional: Negative.        Difficulty sleeping secondary to prednisone.  HENT: Negative.   Eyes: Negative.   Respiratory: Negative.   Cardiovascular: Negative.  Negative for chest pain, palpitations and leg swelling.       History of HTN.   Gastrointestinal: Negative.   Endocrine: Negative.   Genitourinary: Negative.   Skin: Negative for color change, pallor, rash and wound.       Swelling and pain in R foot.   Allergic/Immunologic: Negative.   Neurological: Negative.   Hematological: Negative.   Psychiatric/Behavioral: Negative.     Allergies  Review of patient's allergies indicates no known allergies.  Home Medications   Prior to Admission medications   Medication Sig Start Date End Date Taking? Authorizing Provider  cephALEXin (KEFLEX) 500 MG capsule Take 1 capsule (500 mg total) by mouth 3 (three) times daily. 12/05/13  Yes Reuben Likesavid C Keller, MD  colchicine 0.6 MG tablet Take 2 now and 1 in 1 hour.  May repeat dose once daily.   For gout attack. 12/05/13  Yes Reuben Likesavid C Keller, MD  predniSONE (DELTASONE) 20 MG tablet Take 1 tablet (20 mg total) by mouth 2 (two) times daily. 12/05/13  Yes Reuben Likesavid C Keller, MD  sulfamethoxazole-trimethoprim (BACTRIM DS) 800-160 MG per tablet Take 1 tablet by mouth 2 (two) times daily. 12/05/13  Yes Reuben Likesavid C Keller, MD  cephALEXin (KEFLEX) 500 MG capsule Take 1 capsule (500 mg total) by mouth 3 (three) times daily. 08/07/12   Reuben Likesavid C Keller, MD  HYDROcodone-acetaminophen (NORCO/VICODIN) 5-325 MG per tablet 1 to 2 tabs every 4 to 6 hours as needed for pain. 08/07/12   Reuben Likesavid C Keller, MD  LISINOPRIL PO Take by mouth.    Historical Provider, MD  Metoprolol Succinate (TOPROL XL PO) Take by mouth.    Historical Provider, MD  oxyCODONE-acetaminophen (PERCOCET) 5-325 MG per tablet 1 to 2 tablets every 6 hours as needed for pain. 12/05/13   Reuben Likesavid C Keller, MD   BP 127/84  Pulse 78  Temp(Src) 97.6 F (36.4 C)  Resp 16  SpO2 98%  LMP 11/11/2013  Physical Exam  Nursing note and vitals reviewed. Constitutional: She appears well-developed and well-nourished. No distress.  Cardiovascular: Normal rate, regular rhythm, normal heart sounds and intact distal pulses.  Exam reveals no gallop and no friction rub.   No murmur heard. Pulmonary/Chest: Effort normal and breath sounds normal. No respiratory  distress. She has no wheezes. She has no rales. She exhibits no tenderness.  Musculoskeletal: Normal range of motion. She exhibits edema and tenderness.  Full active and passive ROM with R foot.  Patient is able to ambulate well.    Neurological: She exhibits normal muscle tone. Coordination normal.  Skin: Skin is warm and dry. No rash noted. She is not diaphoretic. No erythema. No pallor.  Moderate swelling noted over dorsum of R foot.  No evidence of warmth.  Able to tolerate palpation and separation distal phalanges of right foot.  No evidence of break in the skin noted.  Skin is warm and dry, with mild erythema and  swelling across the top of the foot.  Patient reports some tingling, but no numbness of R 5th phalange.  CMS intact to all distal phalanges of R foot.  Dorsalis Pedis 2+ bilaterally.  Cap Refill < 3 seconds.     ED Course  Procedures (including critical care time) Labs Review Labs Reviewed - No data to display  Imaging Review No results found.   MDM   1. Follow up   2. Cellulitis of right lower extremity    The patient was advised that her uric acid level was normal. This is consistent with a diagnosis of cellulitis and not gout. Patient requests to stop taking her prednisone due to inability to sleep. Agree decision to discontinue prednisone is at the patient's discretion.  The patient is to return here if symptoms fail to continue to improve or worsen at any point. The patient verbalizes understanding and agrees to plan of care.       Weber Cooksatherine Rossi, NP 12/07/13 1758

## 2013-12-07 NOTE — Discharge Instructions (Signed)
You may stop taking the prednisone if you wish.  Do NOT stop taking the antibiotic until it is all gone.   Cellulitis Cellulitis is an infection of the skin and the tissue beneath it. The infected area is usually red and tender. Cellulitis occurs most often in the arms and lower legs.  CAUSES  Cellulitis is caused by bacteria that enter the skin through cracks or cuts in the skin. The most common types of bacteria that cause cellulitis are Staphylococcus and Streptococcus. SYMPTOMS   Redness and warmth.  Swelling.  Tenderness or pain.  Fever. DIAGNOSIS  Your caregiver can usually determine what is wrong based on a physical exam. Blood tests may also be done. TREATMENT  Treatment usually involves taking an antibiotic medicine. HOME CARE INSTRUCTIONS   Take your antibiotics as directed. Finish them even if you start to feel better.  Keep the infected arm or leg elevated to reduce swelling.  Apply a warm cloth to the affected area up to 4 times per day to relieve pain.  Only take over-the-counter or prescription medicines for pain, discomfort, or fever as directed by your caregiver.  Keep all follow-up appointments as directed by your caregiver. SEEK MEDICAL CARE IF:   You notice red streaks coming from the infected area.  Your red area gets larger or turns dark in color.  Your bone or joint underneath the infected area becomes painful after the skin has healed.  Your infection returns in the same area or another area.  You notice a swollen bump in the infected area.  You develop new symptoms. SEEK IMMEDIATE MEDICAL CARE IF:   You have a fever.  You feel very sleepy.  You develop vomiting or diarrhea.  You have a general ill feeling (malaise) with muscle aches and pains. MAKE SURE YOU:   Understand these instructions.  Will watch your condition.  Will get help right away if you are not doing well or get worse. Document Released: 02/28/2005 Document Revised:  11/20/2011 Document Reviewed: 08/06/2011 [facility] Patient Information 2015 [place], [place]. This information is not intended to replace advice given to you by your health care provider. Make sure you discuss any questions you have with your health care provider.

## 2013-12-07 NOTE — ED Notes (Signed)
C/o steroids are keeping her awake and causing nausea. Swelling in foot is decreased.

## 2013-12-08 NOTE — ED Provider Notes (Signed)
Medical screening examination/treatment/procedure(s) were performed by non-physician practitioner and as supervising physician I was immediately available for consultation/collaboration.  Reed Dady, M.D.   Sheketa Ende C Brenon Antosh, MD 12/08/13 1736 

## 2015-04-21 ENCOUNTER — Encounter (HOSPITAL_COMMUNITY): Payer: Self-pay | Admitting: Neurology

## 2015-04-21 ENCOUNTER — Emergency Department (HOSPITAL_COMMUNITY)
Admission: EM | Admit: 2015-04-21 | Discharge: 2015-04-21 | Disposition: A | Discharge status: Home / Self Care | Payer: Medicaid Other | Attending: Emergency Medicine | Admitting: Emergency Medicine

## 2015-04-21 DIAGNOSIS — Z792 Long term (current) use of antibiotics: Secondary | ICD-10-CM | POA: Insufficient documentation

## 2015-04-21 DIAGNOSIS — Z760 Encounter for issue of repeat prescription: Secondary | ICD-10-CM | POA: Insufficient documentation

## 2015-04-21 DIAGNOSIS — Z79899 Other long term (current) drug therapy: Secondary | ICD-10-CM | POA: Insufficient documentation

## 2015-04-21 DIAGNOSIS — I10 Essential (primary) hypertension: Secondary | ICD-10-CM | POA: Insufficient documentation

## 2015-04-21 DIAGNOSIS — Z7952 Long term (current) use of systemic steroids: Secondary | ICD-10-CM | POA: Insufficient documentation

## 2015-04-21 DIAGNOSIS — Z3202 Encounter for pregnancy test, result negative: Secondary | ICD-10-CM | POA: Insufficient documentation

## 2015-04-21 LAB — BASIC METABOLIC PANEL
Anion gap: 8 (ref 5–15)
BUN: 11 mg/dL (ref 6–20)
CALCIUM: 9 mg/dL (ref 8.9–10.3)
CO2: 24 mmol/L (ref 22–32)
CREATININE: 0.86 mg/dL (ref 0.44–1.00)
Chloride: 104 mmol/L (ref 101–111)
GFR calc Af Amer: >60 mL/min (ref >60)
Glucose, Bld: 86 mg/dL (ref 65–99)
POTASSIUM: 4 mmol/L (ref 3.5–5.1)
SODIUM: 136 mmol/L (ref 135–145)

## 2015-04-21 LAB — CBC WITH DIFFERENTIAL/PLATELET
Basophils Absolute: 0 10*3/uL (ref 0.0–0.1)
Basophils Relative: 0 %
Eosinophils Absolute: 0.2 10*3/uL (ref 0.0–0.7)
Eosinophils Relative: 2 %
HEMATOCRIT: 39.7 % (ref 36.0–46.0)
HEMOGLOBIN: 13.2 g/dL (ref 12.0–15.0)
LYMPHS ABS: 3 10*3/uL (ref 0.7–4.0)
LYMPHS PCT: 44 %
MCH: 27.6 pg (ref 26.0–34.0)
MCHC: 33.2 g/dL (ref 30.0–36.0)
MCV: 83.1 fL (ref 78.0–100.0)
Monocytes Absolute: 0.4 10*3/uL (ref 0.1–1.0)
Monocytes Relative: 6 %
NEUTROS ABS: 3.3 10*3/uL (ref 1.7–7.7)
NEUTROS PCT: 48 %
PLATELETS: 233 10*3/uL (ref 150–400)
RBC: 4.78 MIL/uL (ref 3.87–5.11)
RDW: 13.9 % (ref 11.5–15.5)
WBC: 6.9 10*3/uL (ref 4.0–10.5)

## 2015-04-21 LAB — I-STAT BETA HCG BLOOD, ED (MC, WL, AP ONLY)

## 2015-04-21 MED ORDER — LISINOPRIL 5 MG PO TABS: 5.0000 mg | ORAL_TABLET | Freq: Every day | ORAL | Status: AC

## 2015-04-21 MED ORDER — LISINOPRIL 10 MG PO TABS
5.0000 mg | ORAL_TABLET | Freq: Once | ORAL | Status: AC
  Administered 2015-04-21: 5 mg via ORAL
  Filled 2015-04-21: qty 1

## 2015-04-21 MED ORDER — METOPROLOL SUCCINATE ER 25 MG PO TB24: 25.0000 mg | ORAL_TABLET | Freq: Every day | ORAL | Status: AC

## 2015-04-21 MED ORDER — METOPROLOL TARTRATE 25 MG PO TABS
25.0000 mg | ORAL_TABLET | Freq: Once | ORAL | Status: AC
  Administered 2015-04-21: 25 mg via ORAL
  Filled 2015-04-21: qty 1

## 2015-04-21 NOTE — ED Notes (Signed)
Pt reports HTN, has been out of meds for 1 month. Today frontal h/a, mild dizziness when walking. Pt is a x 4.

## 2015-04-21 NOTE — Discharge Instructions (Signed)
Hypertension Hypertension, commonly called high blood pressure, is when the force of blood pumping through your arteries is too strong. Your arteries are the blood vessels that carry blood from your heart throughout your body. A blood pressure reading consists of a higher number over a lower number, such as 110/72. The higher number (systolic) is the pressure inside your arteries when your heart pumps. The lower number (diastolic) is the pressure inside your arteries when your heart relaxes. Ideally you want your blood pressure below 120/80. Hypertension forces your heart to work harder to pump blood. Your arteries may become narrow or stiff. Having untreated or uncontrolled hypertension can cause heart attack, stroke, kidney disease, and other problems. RISK FACTORS Some risk factors for high blood pressure are controllable. Others are not.  Risk factors you cannot control include:   Race. You may be at higher risk if you are African American.  Age. Risk increases with age.  Gender. Men are at higher risk than women before age 45 years. After age 65, women are at higher risk than men. Risk factors you can control include:  Not getting enough exercise or physical activity.  Being overweight.  Getting too much fat, sugar, calories, or salt in your diet.  Drinking too much alcohol. SIGNS AND SYMPTOMS Hypertension does not usually cause signs or symptoms. Extremely high blood pressure (hypertensive crisis) may cause headache, anxiety, shortness of breath, and nosebleed. DIAGNOSIS To check if you have hypertension, your health care provider will measure your blood pressure while you are seated, with your arm held at the level of your heart. It should be measured at least twice using the same arm. Certain conditions can cause a difference in blood pressure between your right and left arms. A blood pressure reading that is higher than normal on one occasion does not mean that you need treatment. If  it is not clear whether you have high blood pressure, you may be asked to return on a different day to have your blood pressure checked again. Or, you may be asked to monitor your blood pressure at home for 1 or more weeks. TREATMENT Treating high blood pressure includes making lifestyle changes and possibly taking medicine. Living a healthy lifestyle can help lower high blood pressure. You may need to change some of your habits. Lifestyle changes may include:  Following the DASH diet. This diet is high in fruits, vegetables, and whole grains. It is low in salt, red meat, and added sugars.  Keep your sodium intake below 2,300 mg per day.  Getting at least 30-45 minutes of aerobic exercise at least 4 times per week.  Losing weight if necessary.  Not smoking.  Limiting alcoholic beverages.  Learning ways to reduce stress. Your health care provider may prescribe medicine if lifestyle changes are not enough to get your blood pressure under control, and if one of the following is true:  You are 18-59 years of age and your systolic blood pressure is above 140.  You are 60 years of age or older, and your systolic blood pressure is above 150.  Your diastolic blood pressure is above 90.  You have diabetes, and your systolic blood pressure is over 140 or your diastolic blood pressure is over 90.  You have kidney disease and your blood pressure is above 140/90.  You have heart disease and your blood pressure is above 140/90. Your personal target blood pressure may vary depending on your medical conditions, your age, and other factors. HOME CARE INSTRUCTIONS    Have your blood pressure rechecked as directed by your health care provider.   Take medicines only as directed by your health care provider. Follow the directions carefully. Blood pressure medicines must be taken as prescribed. The medicine does not work as well when you skip doses. Skipping doses also puts you at risk for  problems.  Do not smoke.   Monitor your blood pressure at home as directed by your health care provider. SEEK MEDICAL CARE IF:   You think you are having a reaction to medicines taken.  You have recurrent headaches or feel dizzy.  You have swelling in your ankles.  You have trouble with your vision. SEEK IMMEDIATE MEDICAL CARE IF:  You develop a severe headache or confusion.  You have unusual weakness, numbness, or feel faint.  You have severe chest or abdominal pain.  You vomit repeatedly.  You have trouble breathing. MAKE SURE YOU:   Understand these instructions.  Will watch your condition.  Will get help right away if you are not doing well or get worse.   This information is not intended to replace advice given to you by your health care provider. Make sure you discuss any questions you have with your health care provider.   Document Released: 05/21/2005 Document Revised: 10/05/2014 Document Reviewed: 03/13/2013 Elsevier Interactive Patient Education 2016 Elsevier Inc.  

## 2015-04-21 NOTE — ED Provider Notes (Signed)
CSN: [id number]     Arrival date & time 04/21/15  1650 History   First MD Initiated Contact with Patient 04/21/15 2004     Chief Complaint  Patient presents with  . Headache  . Dizziness     (Consider location/radiation/quality/duration/timing/severity/associated sxs/prior Treatment) HPI Comments: Patient here complaining of mild frontal headache and dizziness. Endorses increased stress as well as being out of her high blood pressure medication for the past month. Denies any chest pain or shortness of breath. No vomiting or mental status changes. No upper lotion maybe weakness. Symptoms persistent and nothing makes it better. No treatment use prior to arrival.  Patient is a 44 y.o. female presenting with headaches and dizziness. The history is provided by the patient.  Headache Associated symptoms: dizziness   Dizziness Associated symptoms: headaches     Past Medical History  Diagnosis Date  . Hypertension    History reviewed. No pertinent past surgical history. No family history on file. Social History  Substance Use Topics  . Smoking status: Never Smoker   . Smokeless tobacco: None  . Alcohol Use: No   OB History    No data available     Review of Systems  Neurological: Positive for dizziness and headaches.  All other systems reviewed and are negative.     Allergies  Review of patient's allergies indicates no known allergies.  Home Medications   Prior to Admission medications   Medication Sig Start Date End Date Taking? Authorizing Provider  cephALEXin (KEFLEX) 500 MG capsule Take 1 capsule (500 mg total) by mouth 3 (three) times daily. 08/07/12   Reuben Likesavid C Keller, MD  cephALEXin (KEFLEX) 500 MG capsule Take 1 capsule (500 mg total) by mouth 3 (three) times daily. 12/05/13   Reuben Likesavid C Keller, MD  colchicine 0.6 MG tablet Take 2 now and 1 in 1 hour.  May repeat dose once daily.  For gout attack. 12/05/13   Reuben Likesavid C Keller, MD  HYDROcodone-acetaminophen (NORCO/VICODIN)  5-325 MG per tablet 1 to 2 tabs every 4 to 6 hours as needed for pain. 08/07/12   Reuben Likesavid C Keller, MD  LISINOPRIL PO Take by mouth.    Historical Provider, MD  Metoprolol Succinate (TOPROL XL PO) Take by mouth.    Historical Provider, MD  oxyCODONE-acetaminophen (PERCOCET) 5-325 MG per tablet 1 to 2 tablets every 6 hours as needed for pain. 12/05/13   Reuben Likesavid C Keller, MD  predniSONE (DELTASONE) 20 MG tablet Take 1 tablet (20 mg total) by mouth 2 (two) times daily. 12/05/13   Reuben Likesavid C Keller, MD  sulfamethoxazole-trimethoprim (BACTRIM DS) 800-160 MG per tablet Take 1 tablet by mouth 2 (two) times daily. 12/05/13   Reuben Likesavid C Keller, MD   BP 179/110 mmHg  Pulse 70  Temp(Src) 98.2 F (36.8 C) (Oral)  Resp 18  SpO2 100%  LMP 03/21/2015 Physical Exam  Constitutional: She is oriented to person, place, and time. She appears well-developed and well-nourished.  Non-toxic appearance. No distress.  HENT:  Head: Normocephalic and atraumatic.  Eyes: Conjunctivae, EOM and lids are normal. Pupils are equal, round, and reactive to light.  Neck: Normal range of motion. Neck supple. No tracheal deviation present. No thyroid mass present.  Cardiovascular: Normal rate, regular rhythm and normal heart sounds.  Exam reveals no gallop.   No murmur heard. Pulmonary/Chest: Effort normal and breath sounds normal. No stridor. No respiratory distress. She has no decreased breath sounds. She has no wheezes. She has no rhonchi. She has no rales.  Abdominal: Soft. Normal appearance and bowel sounds are normal. She exhibits no distension. There is no tenderness. There is no rebound and no CVA tenderness.  Musculoskeletal: Normal range of motion. She exhibits no edema or tenderness.  Neurological: She is alert and oriented to person, place, and time. She has normal strength. No cranial nerve deficit or sensory deficit. GCS eye subscore is 4. GCS verbal subscore is 5. GCS motor subscore is 6.  Skin: Skin is warm and dry. No abrasion and  no rash noted.  Psychiatric: She has a normal mood and affect. Her speech is normal and behavior is normal.  Nursing note and vitals reviewed.   ED Course  Procedures (including critical care time) Labs Review Labs Reviewed  CBC WITH DIFFERENTIAL/PLATELET  BASIC METABOLIC PANEL  I-STAT BETA HCG BLOOD, ED ([facility], [facility], [facility] ONLY)    Imaging Review No results found. I have personally reviewed and evaluated these images and lab results as part of my medical decision-making.   EKG Interpretation None      MDM   Final diagnoses:  None    Patient to be placed back on her lisinopril and metoprolol. She has follow-up scheduled with a new PCP    Lorre Nick, MD 04/21/15 2019

## 2015-04-21 NOTE — ED Notes (Signed)
Patient left at this time with all belongings. 

## 2019-03-13 ENCOUNTER — Other Ambulatory Visit: Payer: Self-pay

## 2019-03-13 DIAGNOSIS — Z20822 Contact with and (suspected) exposure to covid-19: Secondary | ICD-10-CM

## 2019-03-14 LAB — NOVEL CORONAVIRUS, NAA: SARS-CoV-2, NAA: NOT DETECTED

## 2019-04-29 ENCOUNTER — Encounter: Payer: Self-pay | Admitting: Obstetrics & Gynecology

## 2019-06-10 ENCOUNTER — Other Ambulatory Visit: Payer: Self-pay

## 2019-06-10 ENCOUNTER — Encounter: Payer: Self-pay | Admitting: Obstetrics and Gynecology

## 2019-06-10 ENCOUNTER — Other Ambulatory Visit (HOSPITAL_COMMUNITY)
Admission: RE | Admit: 2019-06-10 | Discharge: 2019-06-10 | Disposition: A | Discharge status: Home / Self Care | Payer: Self-pay | Source: Ambulatory Visit | Attending: Obstetrics and Gynecology | Admitting: Obstetrics and Gynecology

## 2019-06-10 ENCOUNTER — Ambulatory Visit (INDEPENDENT_AMBULATORY_CARE_PROVIDER_SITE_OTHER): Payer: Self-pay | Admitting: Obstetrics and Gynecology

## 2019-06-10 VITALS — BP 154/112 | HR 86 | Wt 227.3 lb

## 2019-06-10 DIAGNOSIS — Z113 Encounter for screening for infections with a predominantly sexual mode of transmission: Secondary | ICD-10-CM

## 2019-06-10 DIAGNOSIS — N760 Acute vaginitis: Secondary | ICD-10-CM

## 2019-06-10 DIAGNOSIS — N939 Abnormal uterine and vaginal bleeding, unspecified: Secondary | ICD-10-CM | POA: Insufficient documentation

## 2019-06-10 DIAGNOSIS — Z124 Encounter for screening for malignant neoplasm of cervix: Secondary | ICD-10-CM

## 2019-06-10 DIAGNOSIS — Z1151 Encounter for screening for human papillomavirus (HPV): Secondary | ICD-10-CM

## 2019-06-10 DIAGNOSIS — B9689 Other specified bacterial agents as the cause of diseases classified elsewhere: Secondary | ICD-10-CM

## 2019-06-10 DIAGNOSIS — N898 Other specified noninflammatory disorders of vagina: Secondary | ICD-10-CM

## 2019-06-10 MED ORDER — MEDROXYPROGESTERONE ACETATE 10 MG PO TABS: ORAL_TABLET | ORAL | 0 refills | Status: DC

## 2019-06-10 NOTE — Progress Notes (Signed)
Obstetrics and Gynecology New Patient Evaluation  Appointment Date: 06/11/2019  OBGYN Clinic: [facility]  Primary Care Provider: [facility]  Referring Provider: [facility]  Chief Complaint: heavy vaginal bleeding  History of Present Illness: Leslie Wang is a 49 y.o. African-American [id number] (No LMP recorded.), seen for the above chief complaint. Her past medical history is significant for HTN, BMI 30s, tobacco abuse, h/o BTL, h/o STI  Patient initially seen by PCP on 11/9 for 10 day long, heavy VB. She was offered depo provera but declined and was put on OCPs, with last one taken yesterday. She had TSH, CBC, CMP drawn; I don't have the results for these labs.  She states that last week she had two days of somewhat heavy bleeding.     No breast s/s, chest pain, SOB, abdominal pain, dysuria, hematuria, vaginal itching, vaginal discharge,blood in BMs. ?night sweats  Review of Systems: Pertinent items noted in HPI and remainder of comprehensive ROS otherwise negative.    Past Medical History:  Past Medical History:  Diagnosis Date  . Chlamydia   . Hypertension     Past Surgical History:  Past Surgical History:  Procedure Laterality Date  . TUBAL LIGATION      Past Obstetrical History:  OB History  Gravida Para Term Preterm AB Living  5 3     2 3   SAB TAB Ectopic Multiple Live Births  2       3    # Outcome Date GA Lbr Len/2nd Weight Sex Delivery Anes PTL Lv  5 Para           4 Para           3 Para           2 SAB           1 SAB             Obstetric Comments  Vaginal delivery x 3    Past Gynecological History: As per HPI. Periods: prior to most recent episode, she had qmonth, regular periods that were 7-8d and somewhat heavy and crampy the first few days History of Pap Smear(s): records state she had one in 2018, results not seen History of STI(s): Yes.   She is currently using bilateral tubal ligation for  contraception.   Social History:  Social History   Socioeconomic History  . Marital status: Single    Spouse name: Not on file  . Number of children: Not on file  . Years of education: Not on file  . Highest education level: Not on file  Occupational History  . Not on file  Tobacco Use  . Smoking status: Never Smoker  Substance and Sexual Activity  . Alcohol use: No  . Drug use: No  . Sexual activity: Not on file  Other Topics Concern  . Not on file  Social History Narrative  . Not on file   Social Determinants of Health   Financial Resource Strain:   . Difficulty of Paying Living Expenses: Not on file  Food Insecurity:   . Worried About 2019 in the Last Year: Not on file  . Ran Out of Food in the Last Year: Not on file  Transportation Needs:   . Lack of Transportation (Medical): Not on file  . Lack of Transportation (Non-Medical): Not on file  Physical Activity:   . Days of Exercise per Week: Not on file  . Minutes of Exercise  per Session: Not on file  Stress:   . Feeling of Stress : Not on file  Social Connections:   . Frequency of Communication with Friends and Family: Not on file  . Frequency of Social Gatherings with Friends and Family: Not on file  . Attends Religious Services: Not on file  . Active Member of Clubs or Organizations: Not on file  . Attends Archivist Meetings: Not on file  . Marital Status: Not on file  Intimate Partner Violence:   . Fear of Current or Ex-Partner: Not on file  . Emotionally Abused: Not on file  . Physically Abused: Not on file  . Sexually Abused: Not on file    Family History:  She denies any female cancers  Medications [name] had no medications administered during this visit. Current Outpatient Medications  Medication Sig Dispense Refill  . cephALEXin (KEFLEX) 500 MG capsule Take 1 capsule (500 mg total) by mouth 3 (three) times daily. 30 capsule 0  . cephALEXin (KEFLEX) 500 MG capsule  Take 1 capsule (500 mg total) by mouth 3 (three) times daily. 30 capsule 0  . colchicine 0.6 MG tablet Take 2 now and 1 in 1 hour.  May repeat dose once daily.  For gout attack. 12 tablet 0  . hydrochlorothiazide (HYDRODIURIL) 25 MG tablet TAKE 1 TABLET BY MOUTH ONCE DAILY FOR 30 DAYS    . HYDROcodone-acetaminophen (NORCO/VICODIN) 5-325 MG per tablet 1 to 2 tabs every 4 to 6 hours as needed for pain. 20 tablet 0  . lisinopril (PRINIVIL,ZESTRIL) 5 MG tablet Take 1 tablet (5 mg total) by mouth daily. 60 tablet 0  . lisinopril (ZESTRIL) 20 MG tablet Take 20 mg by mouth daily.    . medroxyPROGESTERone (PROVERA) 10 MG tablet 1 tab po q6h for heavy bleeding and call GYN office 60 tablet 0  . metoprolol succinate (TOPROL XL) 25 MG 24 hr tablet Take 1 tablet (25 mg total) by mouth daily. 60 tablet 0  . oxyCODONE-acetaminophen (PERCOCET) 5-325 MG per tablet 1 to 2 tablets every 6 hours as needed for pain. 20 tablet 0  . predniSONE (DELTASONE) 20 MG tablet Take 1 tablet (20 mg total) by mouth 2 (two) times daily. 10 tablet 0  . sulfamethoxazole-trimethoprim (BACTRIM DS) 800-160 MG per tablet Take 1 tablet by mouth 2 (two) times daily. 20 tablet 0   No current facility-administered medications for this visit.    Allergies Patient has no known allergies.   Physical Exam:  BP (!) 154/112   Pulse 86   Wt 227 lb 4.8 oz (103.1 kg)  There is no height or weight on file to calculate BMI. General appearance: Well nourished, well developed female in no acute distress.  Cardiovascular: normal s1 and s2.  No murmurs, rubs or gallops. Respiratory:  Clear to auscultation bilateral. Normal respiratory effort Abdomen: positive bowel sounds and no masses, hernias; diffusely non tender to palpation, non distended Neuro/Psych:  Normal mood and affect.  Skin:  Warm and dry.  Lymphatic:  No inguinal lymphadenopathy.   Pelvic exam: is limited by body habitus EGBUS: within normal limits Vagina: normal, scant old  blood in vault.  Cervix: very anterior, feels hard, non tender, no active bleeding, there is small blood vessel seen at at 12 o'clock approx 2c from the ext os. Cx feels hard on bimanual Uterus:  nonenlarged and non tender and Adnexa:  normal adnexa and no mass, fullness, tenderness Rectovaginal: deferred  See procedure note for pap and  embx  Laboratory: UPT negative  Radiology: none  Assessment: pt stable  Plan:  1. Abnormal uterine bleeding (AUB) Request made to obtain labs from PCP. Pt also asked to call them for results. Follow up labs from today. Likely perimenopausal. Consider u/s in the future. [facility] information given for mammogras - Surgical pathology( [facility]/ [facility]) - Cytology - PAP - Cervicovaginal ancillary only  RTC will call after labs are back to d/w her re: med/surg option. Pt is self pay  Cornelia Copa MD Attending Center for [employer] [facility])

## 2019-06-11 ENCOUNTER — Encounter: Payer: Self-pay | Admitting: Obstetrics and Gynecology

## 2019-06-11 HISTORY — PX: ENDOMETRIAL BIOPSY: PRO73

## 2019-06-11 NOTE — Procedures (Signed)
Endometrial Biopsy Procedure Note  Pre-operative Diagnosis: AUB  Post-operative Diagnosis: same  Procedure Details  Urine pregnancy test was done and result was negative.  The risks (including infection, bleeding, pain, and uterine perforation) and benefits of the procedure were explained to the patient and Written informed consent was obtained.  The patient was placed in the dorsal lithotomy position.  Bimanual exam showed the uterus to be in the anteroflexed position.  A Graves' speculum inserted in the vagina, and the cervix was visualized and a pap smear performed. The cervix was then prepped with povidone iodine, and a sharp tenaculum was applied to the anterior lip of the cervix for stabilization.  A pipelle was inserted into the uterine cavity and sounded the uterus to a depth of 11cm.  A Moderate amount of tissue was collected after 2 passes. The sample was sent for pathologic examination.  Condition: Stable  Complications: None  Plan: The patient was advised to call for any fever or for prolonged or severe pain or bleeding. She was advised to use OTC analgesics as needed for mild to moderate pain. She was advised to avoid vaginal intercourse for 48 hours or until the bleeding has completely stopped.  Cornelia Copa MD Attending Center for [facility])

## 2019-06-12 LAB — CERVICOVAGINAL ANCILLARY ONLY
Bacterial Vaginitis (gardnerella): POSITIVE — AB
Candida Glabrata: NEGATIVE
Candida Vaginitis: NEGATIVE
Chlamydia: NEGATIVE
Comment: NEGATIVE
Comment: NEGATIVE
Comment: NEGATIVE
Comment: NEGATIVE
Comment: NEGATIVE
Comment: NORMAL
Neisseria Gonorrhea: NEGATIVE
Trichomonas: POSITIVE — AB

## 2019-06-12 LAB — CYTOLOGY - PAP
Comment: NEGATIVE
Diagnosis: NEGATIVE
Diagnosis: REACTIVE
High risk HPV: NEGATIVE

## 2019-06-12 LAB — SURGICAL PATHOLOGY

## 2019-06-13 ENCOUNTER — Telehealth: Payer: Self-pay | Admitting: Obstetrics and Gynecology

## 2019-06-13 ENCOUNTER — Encounter: Payer: Self-pay | Admitting: Obstetrics and Gynecology

## 2019-06-13 DIAGNOSIS — A599 Trichomoniasis, unspecified: Secondary | ICD-10-CM | POA: Insufficient documentation

## 2019-06-13 DIAGNOSIS — N939 Abnormal uterine and vaginal bleeding, unspecified: Secondary | ICD-10-CM | POA: Insufficient documentation

## 2019-06-13 MED ORDER — METRONIDAZOLE 500 MG PO TABS: 500.0000 mg | ORAL_TABLET | Freq: Two times a day (BID) | ORAL | 0 refills | Status: DC

## 2019-06-13 NOTE — Telephone Encounter (Signed)
GYN Note  Patient called at [phone] and VM left stating abx was in for the infection; infection could cause the AUB, and I told her to call the office if AUB continued after finishing the abx. At that point, I would recommend a transvaginal u/s and making sure we get the lab records from her pcp. Pap and embx normal  Cornelia Copa MD Attending [facility] (Faculty Practice) 06/13/2019 Time: 1224

## 2019-06-13 NOTE — Addendum Note (Signed)
Addended by: [place] Bing on: 06/13/2019 12:14 PM   Modules accepted: Orders

## 2019-06-15 ENCOUNTER — Telehealth: Payer: Self-pay | Admitting: Obstetrics and Gynecology

## 2019-06-15 ENCOUNTER — Other Ambulatory Visit: Payer: Self-pay

## 2019-06-15 ENCOUNTER — Telehealth: Payer: Self-pay | Admitting: Family Medicine

## 2019-06-15 MED ORDER — METRONIDAZOLE 500 MG PO TABS: 500.0000 mg | ORAL_TABLET | Freq: Two times a day (BID) | ORAL | 0 refills | Status: AC

## 2019-06-15 MED ORDER — MEDROXYPROGESTERONE ACETATE 10 MG PO TABS: ORAL_TABLET | ORAL | 0 refills | Status: DC

## 2019-06-15 NOTE — Telephone Encounter (Signed)
Patient called to say her medication was not at the [facility] on [facility]

## 2019-06-15 NOTE — Telephone Encounter (Signed)
Pt concern addressed in a refill encounter.    Addison Naegeli, RN 06/15/19

## 2019-06-15 NOTE — Telephone Encounter (Signed)
Patient called in and stated that her antibiotic has not been received to her pharmacy and wants to follow-up on this. Patient instructed that a nurse would call her back as soon as they can. Patient verbalized understanding. Message sent to clinical pool.

## 2019-06-15 NOTE — Telephone Encounter (Signed)
Pt called requesting to have medications sent to [facility] on [facility]. Resent Flagyl and Provera per Dr. Vergie Living order to pt's requested pharmacy.  Addison Naegeli, RN 06/15/19

## 2019-06-30 ENCOUNTER — Encounter: Payer: Self-pay | Admitting: General Practice

## 2019-11-23 ENCOUNTER — Other Ambulatory Visit: Payer: Self-pay | Admitting: Obstetrics and Gynecology

## 2019-12-03 NOTE — Telephone Encounter (Signed)
Pt called requesting a refill on her Provera.  Message routed to Dr. Vergie Living for advisement.    Addison Naegeli, RN

## 2020-02-17 ENCOUNTER — Other Ambulatory Visit: Payer: Self-pay | Admitting: Family Medicine

## 2020-02-17 DIAGNOSIS — Z1231 Encounter for screening mammogram for malignant neoplasm of breast: Secondary | ICD-10-CM

## 2020-03-16 ENCOUNTER — Other Ambulatory Visit: Payer: Self-pay

## 2020-03-16 ENCOUNTER — Ambulatory Visit
Admission: RE | Admit: 2020-03-16 | Discharge: 2020-03-16 | Disposition: A | Discharge status: Home / Self Care | Payer: No Typology Code available for payment source | Source: Ambulatory Visit | Attending: Family Medicine | Admitting: Family Medicine

## 2020-03-16 DIAGNOSIS — Z1231 Encounter for screening mammogram for malignant neoplasm of breast: Secondary | ICD-10-CM

## 2020-04-14 ENCOUNTER — Other Ambulatory Visit: Payer: Self-pay | Admitting: Obstetrics and Gynecology

## 2021-04-21 IMAGING — MG DIGITAL SCREENING BILAT W/ TOMO W/ CAD
8 series · 8 of 24 positions shown · non-contrast
Comparison: Previous exam(s).

CLINICAL DATA: Screening.

EXAM:
DIGITAL SCREENING BILATERAL MAMMOGRAM WITH TOMO AND CAD

[L MLO synth-2D]
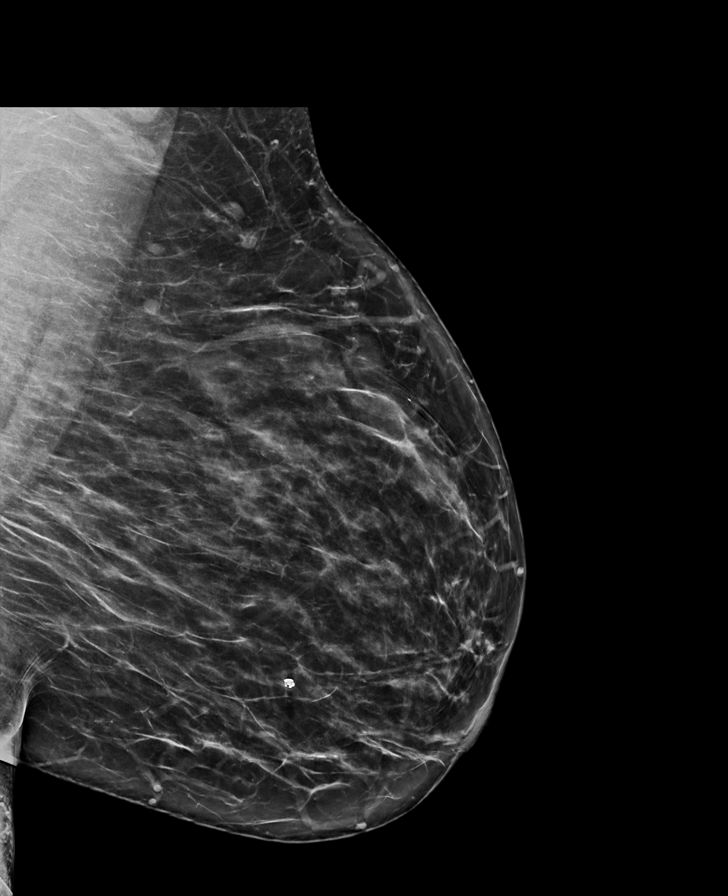

[R CC synth-2D]
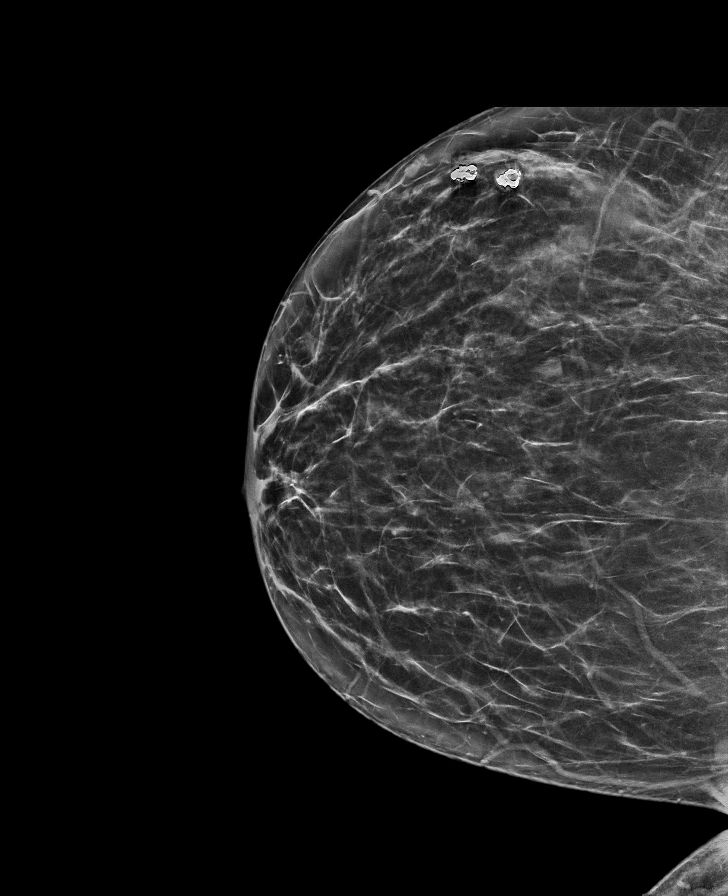

[L CC synth-2D]
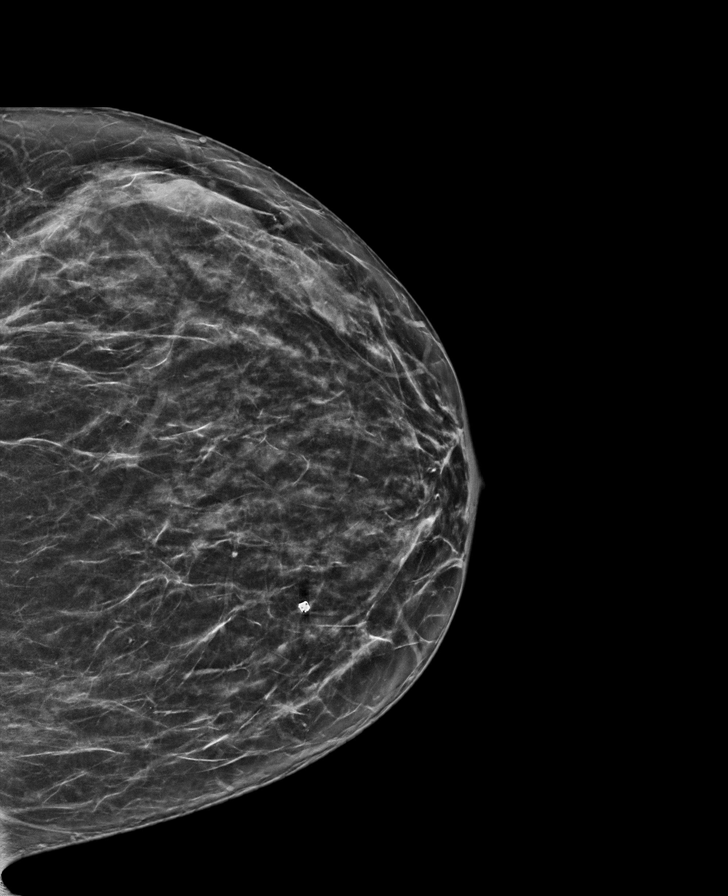

[R MLO synth-2D]
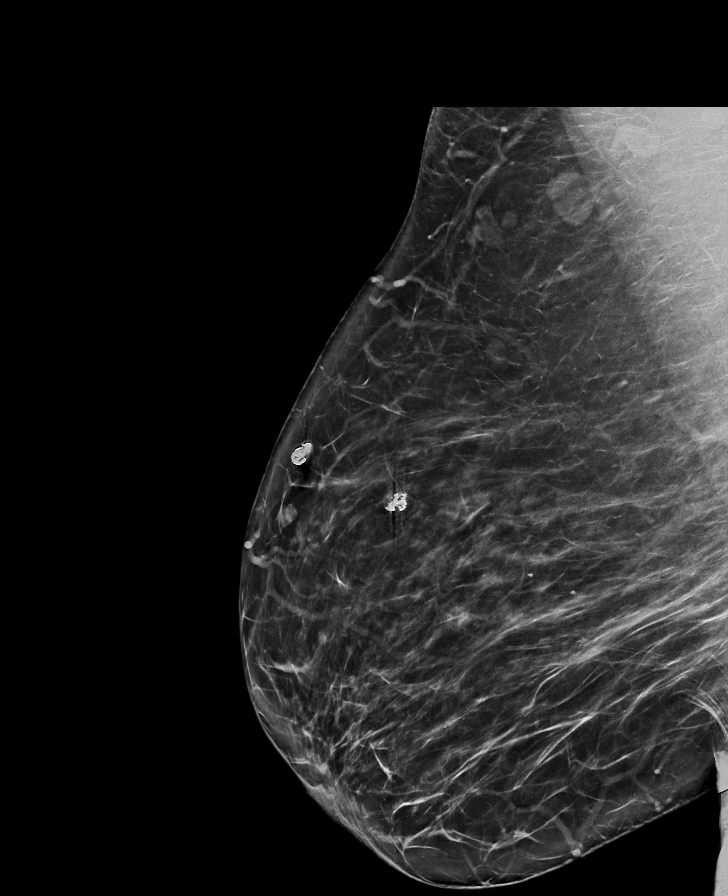

[L CC tomo · tomo slice 35/68.0]
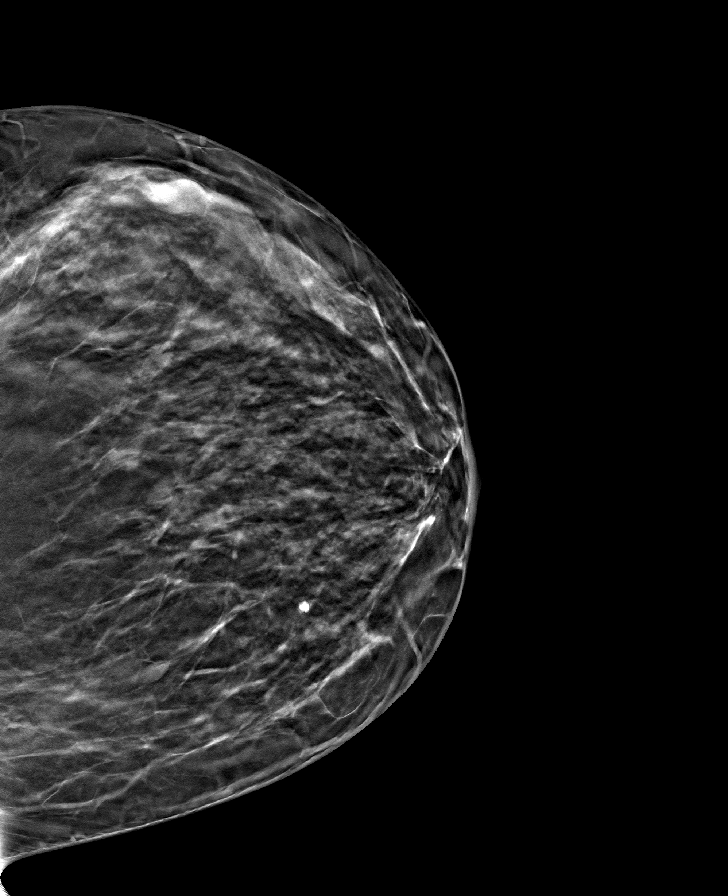

[L MLO tomo · tomo slice 42/83.0]
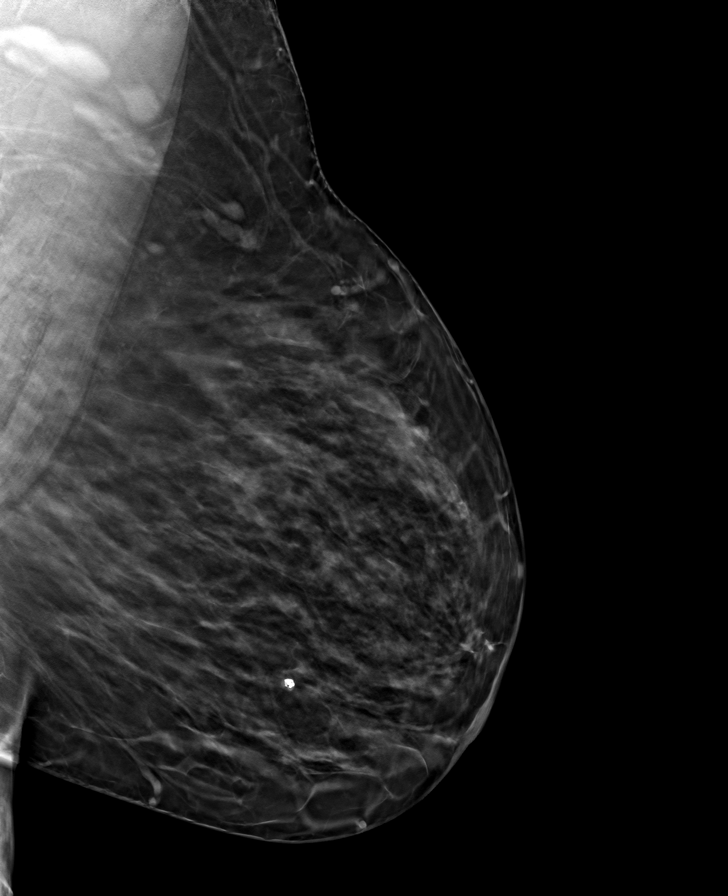

[R CC tomo · tomo slice 36/71.0]
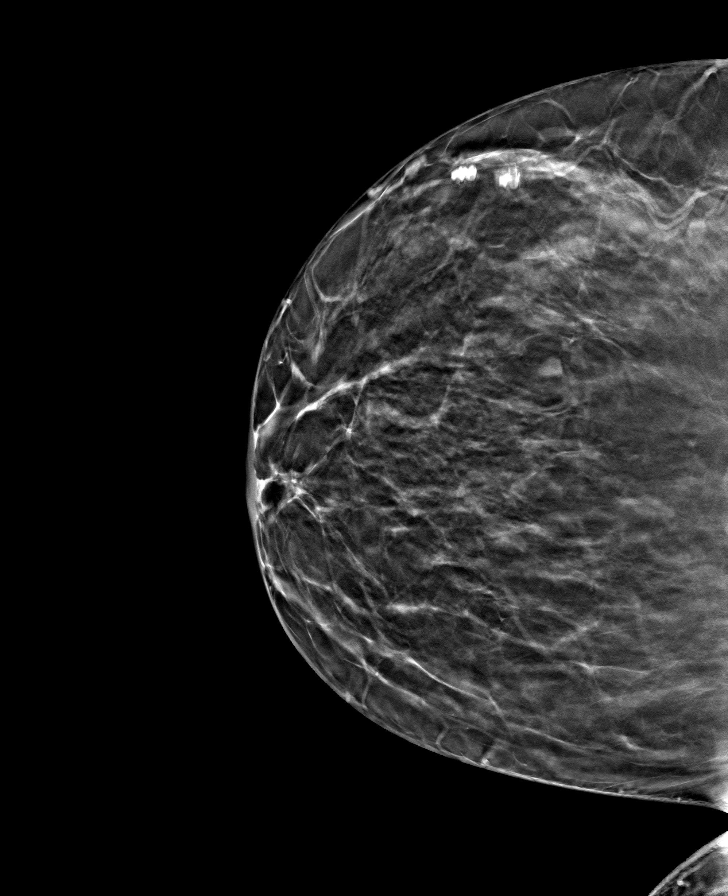

[R MLO tomo · tomo slice 47/92.0]
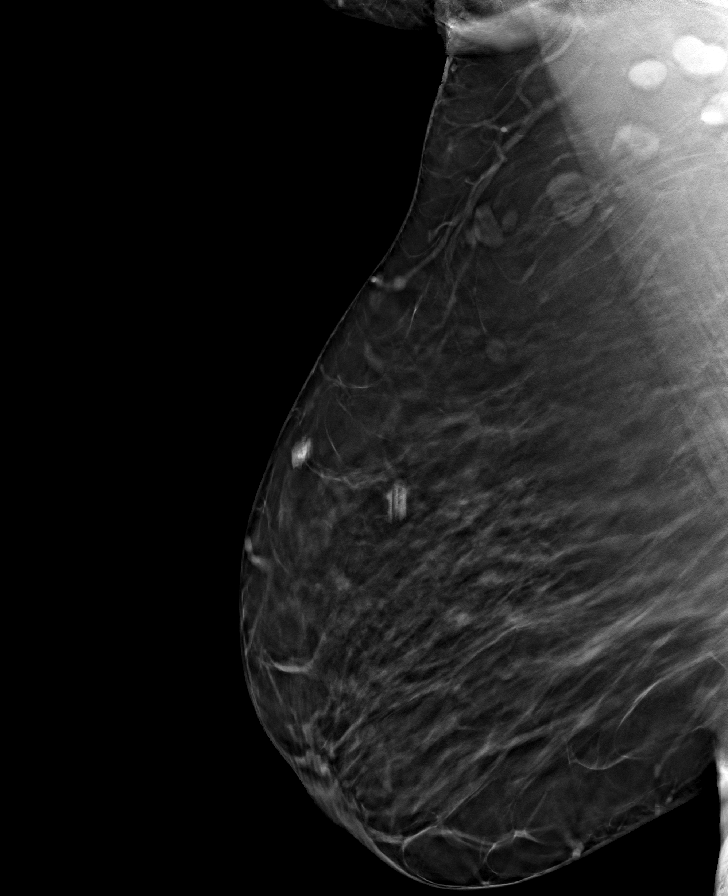

[8 of 24 positions shown; findings below may reference images not displayed]

ACR Breast Density Category b: There are scattered areas of
fibroglandular density.
FINDINGS: There are no findings suspicious for malignancy. Images were
processed with CAD.
IMPRESSION: No mammographic evidence of malignancy. A result letter of this
screening mammogram will be mailed directly to the patient.

RECOMMENDATION:
Screening mammogram in one year. (Code:[id number])

BI-RADS CATEGORY  1: Negative.

## 2022-04-18 DIAGNOSIS — E876 Hypokalemia: Secondary | ICD-10-CM | POA: Insufficient documentation

## 2022-04-18 DIAGNOSIS — R7303 Prediabetes: Secondary | ICD-10-CM | POA: Insufficient documentation

## 2022-04-18 DIAGNOSIS — E782 Mixed hyperlipidemia: Secondary | ICD-10-CM | POA: Insufficient documentation

## 2022-04-20 ENCOUNTER — Other Ambulatory Visit: Payer: Self-pay | Admitting: Physician Assistant

## 2022-04-20 DIAGNOSIS — Z1231 Encounter for screening mammogram for malignant neoplasm of breast: Secondary | ICD-10-CM

## 2022-05-03 ENCOUNTER — Ambulatory Visit
Admission: RE | Admit: 2022-05-03 | Discharge: 2022-05-03 | Disposition: A | Discharge status: Home / Self Care | Payer: No Typology Code available for payment source | Source: Ambulatory Visit | Attending: Physician Assistant | Admitting: Physician Assistant

## 2022-05-03 DIAGNOSIS — Z1231 Encounter for screening mammogram for malignant neoplasm of breast: Secondary | ICD-10-CM

## 2022-07-18 ENCOUNTER — Ambulatory Visit (INDEPENDENT_AMBULATORY_CARE_PROVIDER_SITE_OTHER): Payer: Medicaid Other

## 2022-07-18 ENCOUNTER — Encounter: Payer: Self-pay | Admitting: Podiatry

## 2022-07-18 ENCOUNTER — Ambulatory Visit: Payer: Medicaid Other | Admitting: Podiatry

## 2022-07-18 DIAGNOSIS — M778 Other enthesopathies, not elsewhere classified: Secondary | ICD-10-CM

## 2022-07-18 DIAGNOSIS — M722 Plantar fascial fibromatosis: Secondary | ICD-10-CM

## 2022-07-18 MED ORDER — METHYLPREDNISOLONE 4 MG PO TBPK: ORAL_TABLET | ORAL | 0 refills | Status: DC

## 2022-07-18 MED ORDER — MELOXICAM 15 MG PO TABS: 15.0000 mg | ORAL_TABLET | Freq: Every day | ORAL | 3 refills | Status: DC

## 2022-07-18 MED ORDER — TRIAMCINOLONE ACETONIDE 40 MG/ML IJ SUSP
40.0000 mg | Freq: Once | INTRAMUSCULAR | Status: AC
  Administered 2022-07-18: 40 mg

## 2022-07-18 NOTE — Progress Notes (Signed)
Subjective:  Patient ID: Leslie Wang, female    DOB: 1970-11-09,  MRN: [id number] HPI Chief Complaint  Patient presents with   Foot Pain    Medial foot bilateral (L>R) - aching x several months, having trouble wearing certain shoes, takes gabapentin for neuropathy, no treatment   New Patient (Initial Visit)    52 y.o. female presents with the above complaint.   ROS: Denies fever chills nausea vomit muscle aches pains calf pain back pain chest pain shortness of breath.  Past Medical History:  Diagnosis Date   Chlamydia    Hypertension    Past Surgical History:  Procedure Laterality Date   ENDOMETRIAL BIOPSY  06/11/2019       TUBAL LIGATION      Current Outpatient Medications:    meloxicam (MOBIC) 15 MG tablet, Take 1 tablet (15 mg total) by mouth daily., Disp: 30 tablet, Rfl: 3   methylPREDNISolone (MEDROL DOSEPAK) 4 MG TBPK tablet, 6 day dose pack - take as directed, Disp: 21 tablet, Rfl: 0   potassium chloride SA (KLOR-CON M) 20 MEQ tablet, Take 1 tablet by mouth daily., Disp: , Rfl:    cephALEXin (KEFLEX) 500 MG capsule, Take 1 capsule (500 mg total) by mouth 3 (three) times daily., Disp: 30 capsule, Rfl: 0   cephALEXin (KEFLEX) 500 MG capsule, Take 1 capsule (500 mg total) by mouth 3 (three) times daily., Disp: 30 capsule, Rfl: 0   colchicine 0.6 MG tablet, Take 2 now and 1 in 1 hour.  May repeat dose once daily.  For gout attack., Disp: 12 tablet, Rfl: 0   hydrochlorothiazide (HYDRODIURIL) 25 MG tablet, TAKE 1 TABLET BY MOUTH ONCE DAILY FOR 30 DAYS, Disp: , Rfl:    HYDROcodone-acetaminophen (NORCO/VICODIN) 5-325 MG per tablet, 1 to 2 tabs every 4 to 6 hours as needed for pain., Disp: 20 tablet, Rfl: 0   lisinopril (PRINIVIL,ZESTRIL) 5 MG tablet, Take 1 tablet (5 mg total) by mouth daily., Disp: 60 tablet, Rfl: 0   lisinopril (ZESTRIL) 20 MG tablet, Take 20 mg by mouth daily., Disp: , Rfl:    medroxyPROGESTERone (PROVERA) 10 MG tablet, TAKE 1 TABLET BY MOUTH EVERY 6 HOURS FOR   HEAVY  BLEEDING  AND  CALL  GYN  OFFICE, Disp: 60 tablet, Rfl: 0   metoprolol succinate (TOPROL XL) 25 MG 24 hr tablet, Take 1 tablet (25 mg total) by mouth daily., Disp: 60 tablet, Rfl: 0   oxyCODONE-acetaminophen (PERCOCET) 5-325 MG per tablet, 1 to 2 tablets every 6 hours as needed for pain., Disp: 20 tablet, Rfl: 0   sulfamethoxazole-trimethoprim (BACTRIM DS) 800-160 MG per tablet, Take 1 tablet by mouth 2 (two) times daily., Disp: 20 tablet, Rfl: 0  Current Facility-Administered Medications:    triamcinolone acetonide (KENALOG-40) injection 40 mg, 40 mg, Other, Once, Latrena Benegas T, DPM  No Known Allergies Review of Systems Objective:  There were no vitals filed for this visit.  General: Well developed, nourished, in no acute distress, alert and oriented x3   Dermatological: Skin is warm, dry and supple bilateral. Nails x 10 are well maintained; remaining integument appears unremarkable at this time. There are no open sores, no preulcerative lesions, no rash or signs of infection present.  Vascular: Dorsalis Pedis artery and Posterior Tibial artery pedal pulses are 2/4 bilateral with immedate capillary fill time. Pedal hair growth present. No varicosities and no lower extremity edema present bilateral.   Neruologic: Grossly intact via light touch bilateral. Vibratory intact via tuning fork bilateral.  Protective threshold with Semmes Wienstein monofilament intact to all pedal sites bilateral. Patellar and Achilles deep tendon reflexes 2+ bilateral. No Babinski or clonus noted bilateral.   Musculoskeletal: No gross boney pedal deformities bilateral. No pain, crepitus, or limitation noted with foot and ankle range of motion bilateral. Muscular strength 5/5 in all groups tested bilateral.  Pain on palpation posterior tibial tendon left greater than right.  She also has pain on palpation medial calcaneal tubercle left greater than right no pain on medial and lateral compression of the  calcaneus.  Gait: Unassisted, Nonantalgic.    Radiographs:  Radiographs taken today demonstrate osseously mature individual with good mineralization of the bone.  Pes planovalgus is noted with some early osteoarthritic changes of the tarsometatarsal joints.  Otherwise she has retrocalcaneal spurring and soft tissue increase in density plantar fashion calcaneal insertion site indicative of longstanding fasciitis.  Assessment & Plan:   Assessment: Pes planovalgus posterior tibial tendon dysfunction Planter fasciitis bilateral.  Plan: Discussed etiology pathology conservative versus surgical therapies at this point started her on methylprednisolone to be followed by meloxicam.  Injected the bilateral 20 mg Kenalog 5 mg Marcaine point of maximal tenderness.  Tolerated procedure well.  Dispensed  Braces bilateral.  Will follow-up with her in 1 month.  Discussed appropriate shoe gear stretching exercise ice therapy shoe modifications.     Veida Spira T. [place], [place]

## 2022-07-18 NOTE — Patient Instructions (Signed)

## 2022-08-16 ENCOUNTER — Ambulatory Visit (INDEPENDENT_AMBULATORY_CARE_PROVIDER_SITE_OTHER): Payer: Medicaid Other | Admitting: Podiatry

## 2022-08-16 ENCOUNTER — Encounter: Payer: Self-pay | Admitting: Podiatry

## 2022-08-16 DIAGNOSIS — M722 Plantar fascial fibromatosis: Secondary | ICD-10-CM | POA: Diagnosis not present

## 2022-08-16 MED ORDER — TRIAMCINOLONE ACETONIDE 40 MG/ML IJ SUSP
40.0000 mg | Freq: Once | INTRAMUSCULAR | Status: AC
  Administered 2022-08-16: 40 mg

## 2022-08-16 NOTE — Progress Notes (Signed)
She presents today for follow-up of her Planter fasciitis bilateral.  States that the new balance and the shoes appear to be helping to some degree.  Objective: The pulses are palpable.  Pain on palpation medial calcaneal tubercles.  Assessment: Planter fasciitis bilateral.  Plan: Injected bilateral heels today 20 mg Kenalog 5 mg Marcaine point of maximal tenderness.  Tolerated procedure well continue the use of her oral anti-inflammatory

## 2022-10-02 ENCOUNTER — Encounter: Payer: Self-pay | Admitting: Podiatry

## 2022-10-02 ENCOUNTER — Ambulatory Visit (INDEPENDENT_AMBULATORY_CARE_PROVIDER_SITE_OTHER): Payer: Medicaid Other | Admitting: Podiatry

## 2022-10-02 DIAGNOSIS — M722 Plantar fascial fibromatosis: Secondary | ICD-10-CM

## 2022-10-02 MED ORDER — TRIAMCINOLONE ACETONIDE 40 MG/ML IJ SUSP
20.0000 mg | Freq: Once | INTRAMUSCULAR | Status: AC
  Administered 2022-10-02: 20 mg

## 2022-10-02 NOTE — Progress Notes (Signed)
She presents today for follow-up states that her right foot is doing better but her left foot is still giving her a lot of grief.  Objective: Pain on palpation medial calcaneal tubercle of the left foot pulses are palpable.  No open lesions or wounds.  Assessment: Well resolving Planter fasciitis right.  Left plan fasciitis minimally resolved.  Plan: I reinjected left heel today.

## 2022-11-06 ENCOUNTER — Ambulatory Visit (INDEPENDENT_AMBULATORY_CARE_PROVIDER_SITE_OTHER): Payer: Medicaid Other | Admitting: Podiatry

## 2022-11-06 ENCOUNTER — Encounter: Payer: Self-pay | Admitting: Podiatry

## 2022-11-06 DIAGNOSIS — M722 Plantar fascial fibromatosis: Secondary | ICD-10-CM | POA: Diagnosis not present

## 2022-11-06 MED ORDER — TRIAMCINOLONE ACETONIDE 40 MG/ML IJ SUSP
40.0000 mg | Freq: Once | INTRAMUSCULAR | Status: AC
  Administered 2022-11-06: 40 mg

## 2022-11-06 MED ORDER — METHYLPREDNISOLONE 4 MG PO TBPK: ORAL_TABLET | ORAL | 0 refills | Status: DC

## 2022-11-06 NOTE — Progress Notes (Signed)
She presents today for follow-up of her Planter fasciitis states that she was doing better until she went to the beach.  Objective: Vital signs stable alert oriented x 3 severe pain on palpation medial calcaneal tubercles bilateral.  Assessment: Planter fasciitis bilateral.  Plan: Reinjected bilateral heels today.  Follow-up with her as needed may need to consider orthotics.

## 2022-12-25 ENCOUNTER — Ambulatory Visit: Payer: Medicaid Other | Admitting: Podiatry

## 2022-12-25 DIAGNOSIS — M722 Plantar fascial fibromatosis: Secondary | ICD-10-CM

## 2022-12-25 NOTE — Progress Notes (Signed)
She presents today for a bilateral Planter fasciitis states that the right foot is doing really well and the left 1 is starting to act up now as she points to the medial aspect of the left heel.  Objective: Vital signs are stable she is alert and oriented x 3.  Pulses are palpable.  She has pain on palpation medial calcaneal tubercle of the left heel.  Assessment: Planter fasciitis left heel.  Plan: I injected the left heel today 20 mg Kenalog 5 mg Marcaine and I will follow-up with her in a few weeks.

## 2022-12-29 ENCOUNTER — Other Ambulatory Visit: Payer: Self-pay | Admitting: Podiatry

## 2023-02-01 ENCOUNTER — Other Ambulatory Visit: Payer: Self-pay | Admitting: Podiatry

## 2023-02-25 ENCOUNTER — Other Ambulatory Visit (INDEPENDENT_AMBULATORY_CARE_PROVIDER_SITE_OTHER): Payer: Self-pay

## 2023-02-25 ENCOUNTER — Encounter: Payer: Self-pay | Admitting: Orthopaedic Surgery

## 2023-02-25 ENCOUNTER — Ambulatory Visit (INDEPENDENT_AMBULATORY_CARE_PROVIDER_SITE_OTHER): Payer: Medicaid Other | Admitting: Orthopaedic Surgery

## 2023-02-25 DIAGNOSIS — M25561 Pain in right knee: Secondary | ICD-10-CM

## 2023-02-25 DIAGNOSIS — M25562 Pain in left knee: Secondary | ICD-10-CM

## 2023-02-25 DIAGNOSIS — G8929 Other chronic pain: Secondary | ICD-10-CM | POA: Diagnosis not present

## 2023-02-25 MED ORDER — METHYLPREDNISOLONE ACETATE 40 MG/ML IJ SUSP
40.0000 mg | INTRAMUSCULAR | Status: AC | PRN
Start: 2023-02-25 — End: 2023-02-25
  Administered 2023-02-25: 40 mg via INTRA_ARTICULAR

## 2023-02-25 MED ORDER — LIDOCAINE HCL 1 % IJ SOLN
3.0000 mL | INTRAMUSCULAR | Status: AC | PRN
Start: 2023-02-25 — End: 2023-02-25
  Administered 2023-02-25: 3 mL

## 2023-02-25 NOTE — Progress Notes (Signed)
The patient is a 52 year old female that I am seeing for the first time.  She comes in with bilateral knee pain is getting worse for over 2 years now with no known injury.  She does report that she is "not knees".  She tries to stay active.  She does a lot of exercise walking.  She has never had surgery on her knees.  She has never had injections and no known injuries with her knees.  On exam she does have slight valgus malalignment of both knees when she stands.  Neither knee has an effusion but both knees hyperextend.  Both knees have excellent range of motion bilateral patellofemoral grinding.  2 views of both knees show moderate arthritis that is severe at the patellofemoral joint on both knees.  We did not weigh her today but she definitely could take some weight off her frame that would help with her knees.  We talked about quad strengthening exercises and activity modifications.  I did recommend a steroid injection in both knees today which she agreed to and tolerated well since she never had steroid injections.  She may be a great candidate for hyaluronic acid injections.  I would like to see her back in a month to see how she does with the steroid injections.  At that visit I would like a weight and BMI calculated.    Procedure Note  Patient: Leslie Wang             Date of Birth: Jan 03, 1971           MRN: [id number]             Visit Date: 02/25/2023  Procedures: Visit Diagnoses:  1. Chronic pain of right knee   2. Chronic pain of left knee     Large Joint Inj: R knee on 02/25/2023 4:26 PM Indications: diagnostic evaluation and pain Details: 22 G 1.5 in needle, superolateral approach  Arthrogram: No  Medications: 3 mL lidocaine 1 %; 40 mg methylPREDNISolone acetate 40 MG/ML Outcome: tolerated well, no immediate complications Procedure, treatment alternatives, risks and benefits explained, specific risks discussed. Consent was given by the patient. Immediately prior to  procedure a time out was called to verify the correct patient, procedure, equipment, support staff and site/side marked as required. Patient was prepped and draped in the usual sterile fashion.    Large Joint Inj: L knee on 02/25/2023 4:26 PM Indications: diagnostic evaluation and pain Details: 22 G 1.5 in needle, superolateral approach  Arthrogram: No  Medications: 3 mL lidocaine 1 %; 40 mg methylPREDNISolone acetate 40 MG/ML Outcome: tolerated well, no immediate complications Procedure, treatment alternatives, risks and benefits explained, specific risks discussed. Consent was given by the patient. Immediately prior to procedure a time out was called to verify the correct patient, procedure, equipment, support staff and site/side marked as required. Patient was prepped and draped in the usual sterile fashion.

## 2023-02-26 ENCOUNTER — Ambulatory Visit (INDEPENDENT_AMBULATORY_CARE_PROVIDER_SITE_OTHER): Payer: Medicaid Other | Admitting: Podiatry

## 2023-02-26 ENCOUNTER — Encounter: Payer: Self-pay | Admitting: Podiatry

## 2023-02-26 DIAGNOSIS — M722 Plantar fascial fibromatosis: Secondary | ICD-10-CM | POA: Diagnosis not present

## 2023-02-26 MED ORDER — TRIAMCINOLONE ACETONIDE 40 MG/ML IJ SUSP
40.0000 mg | Freq: Once | INTRAMUSCULAR | Status: AC
Start: 2023-02-26 — End: 2023-02-26
  Administered 2023-02-26: 40 mg

## 2023-02-26 NOTE — Progress Notes (Signed)
She says both of these heels are starting to hurt again I got shots in my knees yesterday and I need shots in my feet today.  Objective: Vital signs are stable alert oriented x 3 pulses are palpable.  She has pain on palpation medial calcaneal tubercles bilateral.  Pulses are palpable.  Assessment: Plantar fasciitis intractable bilateral.  Plan: Injected bilateral heels today 20 mg Kenalog 5 mg Marcaine point maximal tenderness discussed appropriate shoe gear and inserts.

## 2023-02-28 ENCOUNTER — Other Ambulatory Visit: Payer: Self-pay | Admitting: Podiatry

## 2023-03-25 ENCOUNTER — Ambulatory Visit (INDEPENDENT_AMBULATORY_CARE_PROVIDER_SITE_OTHER): Payer: Medicaid Other | Admitting: Orthopaedic Surgery

## 2023-03-25 ENCOUNTER — Encounter: Payer: Self-pay | Admitting: Orthopaedic Surgery

## 2023-03-25 ENCOUNTER — Telehealth: Payer: Self-pay

## 2023-03-25 VITALS — Ht 66.0 in | Wt 274.0 lb

## 2023-03-25 DIAGNOSIS — M25562 Pain in left knee: Secondary | ICD-10-CM

## 2023-03-25 DIAGNOSIS — M1712 Unilateral primary osteoarthritis, left knee: Secondary | ICD-10-CM | POA: Diagnosis not present

## 2023-03-25 DIAGNOSIS — M25561 Pain in right knee: Secondary | ICD-10-CM | POA: Diagnosis not present

## 2023-03-25 DIAGNOSIS — G8929 Other chronic pain: Secondary | ICD-10-CM

## 2023-03-25 DIAGNOSIS — M1711 Unilateral primary osteoarthritis, right knee: Secondary | ICD-10-CM

## 2023-03-25 NOTE — Progress Notes (Signed)
The patient comes in for follow-up after having steroid injections in both her knees for the first time.  She is 52 years old.  She reports cramping in her legs and does state that the injections helped some.  Her BMI today is listed at 44.22.  Both knees have valgus malalignment.  There is a lot of patellofemoral crepitation throughout both knees.  We talked to her about hydration and weight loss as well as seeing her primary care doctor at some point to check her electrolyte levels.  At this point I feel that she is a good candidate for continued weight loss as well as hyaluronic acid to try to treat the pain from osteoarthritis of her knees.  She is agreeable to trying this next step as well as weight loss.  This patient is diagnosed with osteoarthritis of the knee(s).    Radiographs show evidence of joint space narrowing, osteophytes, subchondral sclerosis and/or subchondral cysts.  This patient has knee pain which interferes with functional and activities of daily living.    This patient has experienced inadequate response, adverse effects and/or intolerance with conservative treatments such as acetaminophen, NSAIDS, topical creams, physical therapy or regular exercise, knee bracing and/or weight loss.   This patient has experienced inadequate response or has a contraindication to intra articular steroid injections for at least 3 months.   This patient is not scheduled to have a total knee replacement within 6 months of starting treatment with viscosupplementation.

## 2023-03-25 NOTE — Telephone Encounter (Signed)
Bilateral knee gel injections 

## 2023-03-28 NOTE — Telephone Encounter (Signed)
Called and left a VM advising patient that medicaid does not cover any gel injections.  Advised about TriVisc and to call back to discuss if wanting to proceed.

## 2023-04-08 ENCOUNTER — Other Ambulatory Visit: Payer: Self-pay | Admitting: Physician Assistant

## 2023-04-08 DIAGNOSIS — Z1231 Encounter for screening mammogram for malignant neoplasm of breast: Secondary | ICD-10-CM

## 2023-05-07 ENCOUNTER — Ambulatory Visit: Payer: Medicaid Other

## 2023-05-14 ENCOUNTER — Ambulatory Visit
Admission: RE | Admit: 2023-05-14 | Discharge: 2023-05-14 | Disposition: A | Discharge status: Home / Self Care | Payer: Medicaid Other | Source: Ambulatory Visit | Attending: Physician Assistant | Admitting: Physician Assistant

## 2023-05-14 DIAGNOSIS — Z1231 Encounter for screening mammogram for malignant neoplasm of breast: Secondary | ICD-10-CM

## 2023-10-24 ENCOUNTER — Ambulatory Visit (HOSPITAL_COMMUNITY): Admission: EM | Admit: 2023-10-24 | Discharge: 2023-10-24 | Disposition: A | Discharge status: Home / Self Care

## 2023-10-24 ENCOUNTER — Encounter (HOSPITAL_COMMUNITY): Payer: Self-pay | Admitting: *Deleted

## 2023-10-24 ENCOUNTER — Other Ambulatory Visit: Payer: Self-pay

## 2023-10-24 ENCOUNTER — Emergency Department (HOSPITAL_COMMUNITY)
Admission: EM | Admit: 2023-10-24 | Discharge: 2023-10-25 | Disposition: A | Discharge status: Home / Self Care | Attending: Emergency Medicine | Admitting: Emergency Medicine

## 2023-10-24 ENCOUNTER — Emergency Department (HOSPITAL_COMMUNITY)

## 2023-10-24 DIAGNOSIS — I10 Essential (primary) hypertension: Secondary | ICD-10-CM | POA: Diagnosis not present

## 2023-10-24 DIAGNOSIS — M25461 Effusion, right knee: Secondary | ICD-10-CM | POA: Diagnosis not present

## 2023-10-24 DIAGNOSIS — M25561 Pain in right knee: Secondary | ICD-10-CM | POA: Diagnosis present

## 2023-10-24 DIAGNOSIS — M25471 Effusion, right ankle: Secondary | ICD-10-CM

## 2023-10-24 DIAGNOSIS — Z79899 Other long term (current) drug therapy: Secondary | ICD-10-CM | POA: Diagnosis not present

## 2023-10-24 DIAGNOSIS — M79661 Pain in right lower leg: Secondary | ICD-10-CM | POA: Diagnosis not present

## 2023-10-24 MED ORDER — OXYCODONE-ACETAMINOPHEN 5-325 MG PO TABS
1.0000 | ORAL_TABLET | Freq: Once | ORAL | Status: AC
  Administered 2023-10-24: 1 via ORAL
  Filled 2023-10-24: qty 1

## 2023-10-24 NOTE — ED Notes (Signed)
 Patient is being discharged from the Urgent Care and sent to the Emergency Department via POV . Per Vonda Guadeloupe, patient is in need of higher level of care due to right leg swelling. Patient is aware and verbalizes understanding of plan of care.  Vitals:   10/24/23 1938  BP: (!) 156/99  Pulse: 91  Resp: 20  Temp: 98.4 F (36.9 C)  SpO2: 98%

## 2023-10-24 NOTE — ED Triage Notes (Signed)
 Patient reports right knee pain/swelling ( arthritis) onset yesterday , denies injury .

## 2023-10-24 NOTE — ED Triage Notes (Signed)
 Pt states has bilateral hand pain X 3 weeks.    She states she has right knee and lower leg pain and swelling X3 weeks. The swelling has got worse since Monday when she seen her pcp.    She states she was given mobic  but it doesn't help.

## 2023-10-24 NOTE — Discharge Instructions (Signed)
 Go to ER to have further work up for blood clot and possible gout

## 2023-10-24 NOTE — ED Provider Notes (Signed)
 [facility]    CSN: [id number] Arrival date & time: 10/24/23  1800      History   Chief Complaint Chief Complaint  Patient presents with   Knee Pain   Hand Pain    HPI Leslie Wang is a 53 y.o. female who presents with worse R knee pain since 2 days ago and is swelling down R lower leg. Has hx of OA but this pain and swelling is different. She denies laying around still for days, but instead she moves around a lot. She saw her PCP 4 days ago for HTN and she noticed mild swelling on her R knee, but now is worse.    Past Medical History:  Diagnosis Date   Chlamydia    Hypertension     Patient Active Problem List   Diagnosis Date Noted   Hypokalemia 04/18/2022   Moderate mixed hyperlipidemia not requiring statin therapy 04/18/2022   Prediabetes 04/18/2022   Trichomoniasis 06/13/2019   Abnormal uterine bleeding (AUB) 06/13/2019   HYPERTENSION 03/10/2007   BREAST BIOPSY, HX OF 02/05/2007    Past Surgical History:  Procedure Laterality Date   ENDOMETRIAL BIOPSY  06/11/2019       TUBAL LIGATION      OB History     Gravida  5   Para  3   Term      Preterm      AB  2   Living  3      SAB  2   IAB      Ectopic      Multiple      Live Births  3        Obstetric Comments  Vaginal delivery x 3          Home Medications    Prior to Admission medications   Medication Sig Start Date End Date Taking? Authorizing Provider  hydrochlorothiazide (HYDRODIURIL) 25 MG tablet 12.5 mg. 01/21/19  Yes [provider]  lisinopril  (PRINIVIL ,ZESTRIL ) 5 MG tablet Take 1 tablet (5 mg total) by mouth daily. 04/21/15  Yes Lind Repine, MD  lisinopril  (ZESTRIL ) 20 MG tablet Take 20 mg by mouth daily. 01/21/19  Yes [provider]  meloxicam  (MOBIC ) 15 MG tablet Take 1 tablet by mouth once daily 02/28/23  Yes Standiford, Karlene Overcast, DPM  metoprolol  succinate (TOPROL  XL) 25 MG 24 hr tablet Take 1 tablet (25 mg total) by mouth daily.  04/21/15  Yes Lind Repine, MD  colchicine  0.6 MG tablet Take 2 now and 1 in 1 hour.  May repeat dose once daily.  For gout attack. 12/05/13   Shanda Dark, MD  medroxyPROGESTERone  (PROVERA ) 10 MG tablet TAKE 1 TABLET BY MOUTH EVERY 6 HOURS FOR  HEAVY  BLEEDING  AND  CALL  GYN  OFFICE 12/04/19   Raynell Caller, MD  potassium chloride SA (KLOR-CON M) 20 MEQ tablet Take 1 tablet by mouth daily. 05/15/22 05/15/23  [provider]    Family History Family History  Problem Relation Age of Onset   BRCA 1/2 Neg Hx    Breast cancer Neg Hx     Social History Social History   Tobacco Use   Smoking status: Never  Vaping Use   Vaping status: Never Used  Substance Use Topics   Alcohol use: No   Drug use: No     Allergies   Patient has no known allergies.   Review of Systems Review of Systems As noted in HPI  Physical Exam  Triage Vital Signs ED Triage Vitals  Encounter Vitals Group     BP 10/24/23 1938 (!) 156/99     Systolic BP Percentile --      Diastolic BP Percentile --      Pulse Rate 10/24/23 1938 91     Resp 10/24/23 1938 20     Temp 10/24/23 1938 98.4 F (36.9 C)     Temp Source 10/24/23 1938 Oral     SpO2 10/24/23 1938 98 %     Weight --      Height --      Head Circumference --      Peak Flow --      Pain Score 10/24/23 1935 9     Pain Loc --      Pain Education --      Exclude from Growth Chart --    No data found.  Updated Vital Signs BP (!) 156/99 (BP Location: Right Arm)   Pulse 91   Temp 98.4 F (36.9 C) (Oral)   Resp 20   LMP 11/21/2022   SpO2 98%   Visual Acuity Right Eye Distance:   Left Eye Distance:   Bilateral Distance:    Right Eye Near:   Left Eye Near:    Bilateral Near:     Physical Exam Vitals and nursing note reviewed.  Constitutional:      General: She is not in acute distress.    Appearance: She is obese. She is not toxic-appearing.  HENT:     Right Ear: External ear normal.     Left Ear: External ear  normal.  Eyes:     General: No scleral icterus.    Conjunctiva/sclera: Conjunctivae normal.  Cardiovascular:     Pulses: Normal pulses.  Pulmonary:     Effort: Pulmonary effort is normal.  Musculoskeletal:     Cervical back: Neck supple.     Comments: R KNEE- with moderate swelling and decreased ROM due to pain R LOWER LEG- with tender calf, negative cord R ANKLE- with moderate swelling and tenderness on lateral and medial areas. This is not hot. ROM is normal with mild pain.   Skin:    General: Skin is warm and dry.     Capillary Refill: Capillary refill takes less than 2 seconds.     Comments: R knee area is hot but not erythematous  Neurological:     Mental Status: She is alert and oriented to person, place, and time.     Gait: Gait normal.  Psychiatric:        Mood and Affect: Mood normal.        Behavior: Behavior normal.        Thought Content: Thought content normal.        Judgment: Judgment normal.      UC Treatments / Results  Labs (all labs ordered are listed, but only abnormal results are displayed) Labs Reviewed - No data to display  EKG   Radiology DG Knee Complete 4 Views Right Result Date: 10/24/2023 CLINICAL DATA:  Swelling pain EXAM: RIGHT KNEE - COMPLETE 4+ VIEW COMPARISON:  02/25/2023 FINDINGS: No acute fracture or malalignment. 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 Positive for knee effusion. IMPRESSION: Tricompartment arthritis with knee effusion. Electronically Signed   By: Esmeralda Hedge M.D.   On: 10/24/2023 21:03    Procedures Procedures (including critical care time)  Medications Ordered in UC Medications - No data to display  Initial Impression / Assessment and Plan / UC Course  I have reviewed the triage vital signs and the nursing notes.  R knee swelling may be gout R lower leg swelling and calf pain, which I am concerned for DVT She was sent to ER for further work  up.     Final Clinical Impressions(s) / UC Diagnoses   Final diagnoses:  Pain and swelling of right knee  Right calf pain  Ankle swelling, right     Discharge Instructions      Go to ER to have further work up for blood clot and possible gout    ED Prescriptions   None    PDMP not reviewed this encounter.   Vonda Guadeloupe, PA-C 10/24/23 2119

## 2023-10-25 ENCOUNTER — Ambulatory Visit (HOSPITAL_COMMUNITY)
Admission: RE | Admit: 2023-10-25 | Discharge: 2023-10-25 | Disposition: A | Discharge status: Home / Self Care | Source: Ambulatory Visit | Attending: Vascular Surgery | Admitting: Vascular Surgery

## 2023-10-25 ENCOUNTER — Telehealth: Payer: Self-pay

## 2023-10-25 ENCOUNTER — Other Ambulatory Visit (HOSPITAL_COMMUNITY): Payer: Self-pay | Admitting: Emergency Medicine

## 2023-10-25 DIAGNOSIS — M79661 Pain in right lower leg: Secondary | ICD-10-CM | POA: Diagnosis present

## 2023-10-25 DIAGNOSIS — M7989 Other specified soft tissue disorders: Secondary | ICD-10-CM | POA: Insufficient documentation

## 2023-10-25 MED ORDER — KETOROLAC TROMETHAMINE 15 MG/ML IJ SOLN: 15.0000 mg | Freq: Once | INTRAMUSCULAR | Status: DC

## 2023-10-25 MED ORDER — ACETAMINOPHEN 325 MG PO TABS
650.0000 mg | ORAL_TABLET | ORAL | Status: AC
  Administered 2023-10-25: 650 mg via ORAL
  Filled 2023-10-25: qty 2

## 2023-10-25 NOTE — ED Provider Notes (Signed)
 [place] EMERGENCY DEPARTMENT AT [facility] Provider Note   CSN: [id number] Arrival date & time: 10/24/23  2028     History  Chief Complaint  Patient presents with   Right Knee Pain/Swelling    Arthritis    Leslie Wang is a 53 y.o. female with history of chlamydia and hypertension.  The patient presents to the ED with her daughter for evaluation of right knee pain and swelling.  The patient reports longstanding history of arthritis.  She reports longstanding history of right knee pain.  She is currently being treated by her PCP for this knee pain.  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  She states that she has had worsening swelling of her right knee over the last 3 days and she went to urgent care today for evaluation.  She states that urgent care they were concerned about DVT and sent her to the ED for evaluation.  The patient takes hydrochlorothiazide 50 mg for leg swelling.  She reports that she does have leg swelling in her right ankle but states that her leg swelling is around the baseline.  Denies any fevers, nausea or vomiting at home.  Denies any warmth, heat from the right knee.  Denies chest pain or shortness of breath.  HPI     Home Medications Prior to Admission medications   Medication Sig Start Date End Date Taking? Authorizing Provider  colchicine  0.6 MG tablet Take 2 now and 1 in 1 hour.  May repeat dose once daily.  For gout attack. 12/05/13   Shanda Dark, MD  hydrochlorothiazide (HYDRODIURIL) 25 MG tablet 12.5 mg. 01/21/19   [provider]  lisinopril  (PRINIVIL ,ZESTRIL ) 5 MG tablet Take 1 tablet (5 mg total) by mouth daily. 04/21/15   Lind Repine, MD  lisinopril  (ZESTRIL ) 20 MG tablet Take 20 mg by mouth daily. 01/21/19   [provider]  medroxyPROGESTERone  (PROVERA ) 10 MG tablet TAKE 1 TABLET BY MOUTH EVERY 6 HOURS FOR  HEAVY  BLEEDING  AND  CALL  GYN  OFFICE 12/04/19   Raynell Caller, MD  meloxicam  (MOBIC ) 15 MG tablet Take 1 tablet by mouth once daily 02/28/23   Standiford, Karlene Overcast, DPM  metoprolol  succinate (TOPROL  XL) 25 MG 24 hr tablet Take 1 tablet (25 mg total) by mouth daily. 04/21/15   Lind Repine, MD  potassium chloride SA (KLOR-CON M) 20 MEQ tablet Take 1 tablet by mouth daily. 05/15/22 05/15/23  [provider]      Allergies    Patient has no known allergies.    Review of Systems   Review of Systems  Cardiovascular:  Positive for leg swelling.  Musculoskeletal:  Positive for arthralgias.  All other systems reviewed and are negative.   Physical Exam Updated Vital Signs BP (!) 150/80   Pulse 81   Temp 98.4 F (36.9 C)   Resp 18   LMP 11/21/2022   SpO2 100%  Physical Exam Vitals and nursing note reviewed.  Constitutional:      General: She is not in acute distress.    Appearance: She is well-developed.  HENT:     Head: Normocephalic and atraumatic.  Eyes:     Conjunctiva/sclera: Conjunctivae normal.  Cardiovascular:     Rate and Rhythm: Normal rate and regular rhythm.     Heart sounds: No murmur heard. Pulmonary:     Effort: Pulmonary effort is normal. No respiratory distress.  Breath sounds: Normal breath sounds.  Abdominal:     Palpations: Abdomen is soft.     Tenderness: There is no abdominal tenderness.  Musculoskeletal:        General: No swelling.     Cervical back: Neck supple.     Right knee: Swelling present.     Right lower leg: Edema present.     Comments: Right knee without erythema, warmth.  There is soft tissue swelling.  Reduced range of motion secondary to swelling.  Patient right ankle also with edema.  Skin:    General: Skin is warm and dry.     Capillary Refill: Capillary refill takes less than 2 seconds.  Neurological:     Mental Status: She is alert and oriented to person, place, and time.  Psychiatric:        Mood and Affect: Mood normal.     ED Results / Procedures / Treatments    Labs (all labs ordered are listed, but only abnormal results are displayed) Labs Reviewed - No data to display  EKG None  Radiology DG Knee Complete 4 Views Right Result Date: 10/24/2023 CLINICAL DATA:  Swelling pain EXAM: RIGHT KNEE - COMPLETE 4+ VIEW COMPARISON:  02/25/2023 FINDINGS: No acute fracture or malalignment. 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 Positive for knee effusion. IMPRESSION: Tricompartment arthritis with knee effusion. Electronically Signed   By: Esmeralda Hedge M.D.   On: 10/24/2023 21:03    Procedures Procedures   Medications Ordered in ED Medications  oxyCODONE -acetaminophen  (PERCOCET/ROXICET) 5-325 MG per tablet 1 tablet (1 tablet Oral Given 10/24/23 2038)  acetaminophen  (TYLENOL ) tablet 650 mg (650 mg Oral Given 10/25/23 0031)    ED Course/ Medical Decision Making/ A&P  Medical Decision Making Amount and/or Complexity of Data Reviewed Radiology: ordered.  Risk Prescription drug management.   53 year old female presents for evaluation.  Please see HPI for further details.  On examination the patient is afebrile and nontachycardic.  Her lung sounds are clear bilaterally, she is nonhypoxic.  Her abdomen soft compressible.  Her neurological examination is at baseline.  Patient right knee does have swelling, reduced range of motion secondary to swelling and pain.  There is no erythema, warmth.  She is not febrile or tachycardic so I doubt septic arthritis.  Plain film imaging does show joint effusion however not unexpected in the setting of arthritis which she reports longstanding history of.  Right ankle also with swelling but negative Homans' sign. 2+ DP pulse in the right foot.  Localized ankle swelling present however patient reports that she takes HCTZ for leg swelling. No ultrasound here at this time so will have patient return in AM for DVT study.  Will provide her with Ace wrap for  compressive support of her right knee.  I have advised her to take Tylenol , ibuprofen  at home and follow-up with her PCP for evaluation of arthritis.  She voices understanding.  Stable to discharge home.   Final Clinical Impression(s) / ED Diagnoses Final diagnoses:  Pain and swelling of right knee    Rx / DC Orders ED Discharge Orders     None         Kristin Peyer 10/25/23 0033    Palumbo, April, MD 10/25/23 [id number]

## 2023-10-25 NOTE — Discharge Instructions (Addendum)
 It was a pleasure taking part in your care.  As we discussed, you do have some fluid in your knee which is expected with arthritis.  Please wear Ace wrap for compressive support.  Due to concern of DVT, please return in the morning at 8 AM to have DVT study performed.  Please follow-up at the [facility].  Their address is [address], [place], [place].  Please arrive at 8 AM and report to the fourth floor registration Zone-A.  Informed them that you are there for a vascular study.  Please continue taking ibuprofen , Mobic  at home for pain in your right knee.  Please follow-up with PCP for evaluation of right knee pain.  Return to the ED with any new or worsening symptoms.

## 2023-10-25 NOTE — Telephone Encounter (Signed)
 Pt called wanting to know the results of her ultrasound.  Patient was advised to check her MyChart.   Pt c/o of pain.  Pt advised to call her PCP or go to Urgent Care or ED.

## 2024-04-08 LAB — COLOGUARD: COLOGUARD: NEGATIVE

## 2024-04-14 ENCOUNTER — Other Ambulatory Visit: Payer: Self-pay | Admitting: Physician Assistant

## 2024-04-14 DIAGNOSIS — Z1231 Encounter for screening mammogram for malignant neoplasm of breast: Secondary | ICD-10-CM

## 2024-05-14 ENCOUNTER — Ambulatory Visit
Admission: RE | Admit: 2024-05-14 | Discharge: 2024-05-14 | Disposition: A | Discharge status: Home / Self Care | Source: Ambulatory Visit | Attending: Physician Assistant | Admitting: Physician Assistant

## 2024-05-14 DIAGNOSIS — Z1231 Encounter for screening mammogram for malignant neoplasm of breast: Secondary | ICD-10-CM
